# Patient Record
Sex: Male | Born: 1945 | Race: White | Marital: Married | Smoking: Former smoker
Health system: Southern US, Community
[De-identification: ages and names within clinical notes are randomized; demographics above are authoritative.]

## PROBLEM LIST (undated history)

## (undated) DIAGNOSIS — I10 Essential (primary) hypertension: Secondary | ICD-10-CM

## (undated) DIAGNOSIS — I714 Abdominal aortic aneurysm, without rupture, unspecified: Secondary | ICD-10-CM

## (undated) DIAGNOSIS — G629 Polyneuropathy, unspecified: Secondary | ICD-10-CM

## (undated) DIAGNOSIS — F0390 Unspecified dementia without behavioral disturbance: Secondary | ICD-10-CM

## (undated) DIAGNOSIS — E079 Disorder of thyroid, unspecified: Secondary | ICD-10-CM

## (undated) DIAGNOSIS — E119 Type 2 diabetes mellitus without complications: Secondary | ICD-10-CM

## (undated) DIAGNOSIS — K274 Chronic or unspecified peptic ulcer, site unspecified, with hemorrhage: Secondary | ICD-10-CM

## (undated) HISTORY — PX: TOTAL KNEE ARTHROPLASTY: SHX125

## (undated) HISTORY — PX: OTHER SURGICAL HISTORY: SHX169

## (undated) HISTORY — PX: SHOULDER SURGERY: SHX246

---

## 2015-03-26 ENCOUNTER — Other Ambulatory Visit: Payer: Self-pay | Admitting: Orthopedic Surgery

## 2015-03-26 DIAGNOSIS — M5136 Other intervertebral disc degeneration, lumbar region: Secondary | ICD-10-CM

## 2015-04-02 ENCOUNTER — Ambulatory Visit
Admission: RE | Admit: 2015-04-02 | Discharge: 2015-04-02 | Disposition: A | Discharge status: Home / Self Care | Payer: No Typology Code available for payment source | Source: Ambulatory Visit | Attending: Orthopedic Surgery | Admitting: Orthopedic Surgery

## 2015-04-02 ENCOUNTER — Other Ambulatory Visit: Payer: Self-pay | Admitting: Orthopedic Surgery

## 2015-04-02 ENCOUNTER — Inpatient Hospital Stay
Admission: RE | Admit: 2015-04-02 | Discharge: 2015-04-02 | Disposition: A | Discharge status: Home / Self Care | Payer: Self-pay | Source: Ambulatory Visit | Attending: Orthopedic Surgery | Admitting: Orthopedic Surgery

## 2015-04-02 DIAGNOSIS — R52 Pain, unspecified: Secondary | ICD-10-CM

## 2015-04-02 DIAGNOSIS — M5136 Other intervertebral disc degeneration, lumbar region: Secondary | ICD-10-CM

## 2015-04-02 MED ORDER — IOHEXOL 180 MG/ML  SOLN
15.0000 mL | Freq: Once | INTRAMUSCULAR | Status: AC | PRN
  Administered 2015-04-02: 15 mL via INTRATHECAL

## 2015-04-02 MED ORDER — DIAZEPAM 5 MG PO TABS
5.0000 mg | ORAL_TABLET | Freq: Once | ORAL | Status: AC
  Administered 2015-04-02: 5 mg via ORAL

## 2015-04-02 NOTE — Progress Notes (Signed)
Patient states he has been off Plavix for at least the past five days.  Lorren Rossetti, RN 

## 2015-04-02 NOTE — Discharge Instructions (Signed)

## 2017-11-09 ENCOUNTER — Emergency Department (HOSPITAL_BASED_OUTPATIENT_CLINIC_OR_DEPARTMENT_OTHER)
Admission: EM | Admit: 2017-11-09 | Discharge: 2017-11-09 | Disposition: A | Discharge status: Home / Self Care | Payer: Medicare Other | Attending: Emergency Medicine | Admitting: Emergency Medicine

## 2017-11-09 ENCOUNTER — Other Ambulatory Visit: Payer: Self-pay

## 2017-11-09 ENCOUNTER — Emergency Department (HOSPITAL_BASED_OUTPATIENT_CLINIC_OR_DEPARTMENT_OTHER): Payer: Medicare Other

## 2017-11-09 ENCOUNTER — Encounter (HOSPITAL_BASED_OUTPATIENT_CLINIC_OR_DEPARTMENT_OTHER): Payer: Self-pay | Admitting: *Deleted

## 2017-11-09 DIAGNOSIS — K4090 Unilateral inguinal hernia, without obstruction or gangrene, not specified as recurrent: Secondary | ICD-10-CM | POA: Diagnosis not present

## 2017-11-09 DIAGNOSIS — Z87891 Personal history of nicotine dependence: Secondary | ICD-10-CM | POA: Diagnosis not present

## 2017-11-09 DIAGNOSIS — E119 Type 2 diabetes mellitus without complications: Secondary | ICD-10-CM | POA: Insufficient documentation

## 2017-11-09 DIAGNOSIS — I10 Essential (primary) hypertension: Secondary | ICD-10-CM | POA: Insufficient documentation

## 2017-11-09 DIAGNOSIS — R109 Unspecified abdominal pain: Secondary | ICD-10-CM

## 2017-11-09 DIAGNOSIS — F0390 Unspecified dementia without behavioral disturbance: Secondary | ICD-10-CM | POA: Diagnosis not present

## 2017-11-09 DIAGNOSIS — K429 Umbilical hernia without obstruction or gangrene: Secondary | ICD-10-CM | POA: Insufficient documentation

## 2017-11-09 DIAGNOSIS — Z79891 Long term (current) use of opiate analgesic: Secondary | ICD-10-CM | POA: Insufficient documentation

## 2017-11-09 HISTORY — DX: Abdominal aortic aneurysm, without rupture, unspecified: I71.40

## 2017-11-09 HISTORY — DX: Essential (primary) hypertension: I10

## 2017-11-09 HISTORY — DX: Type 2 diabetes mellitus without complications: E11.9

## 2017-11-09 HISTORY — DX: Polyneuropathy, unspecified: G62.9

## 2017-11-09 HISTORY — DX: Abdominal aortic aneurysm, without rupture: I71.4

## 2017-11-09 HISTORY — DX: Disorder of thyroid, unspecified: E07.9

## 2017-11-09 LAB — CBC WITH DIFFERENTIAL/PLATELET
Abs Immature Granulocytes: 0.14 10*3/uL — ABNORMAL HIGH (ref 0.00–0.07)
BASOS PCT: 1 %
Basophils Absolute: 0 10*3/uL (ref 0.0–0.1)
EOS PCT: 1 %
Eosinophils Absolute: 0.1 10*3/uL (ref 0.0–0.5)
HCT: 42.6 % (ref 39.0–52.0)
HEMOGLOBIN: 14.6 g/dL (ref 13.0–17.0)
Immature Granulocytes: 2 %
Lymphocytes Relative: 18 %
Lymphs Abs: 1.3 10*3/uL (ref 0.7–4.0)
MCH: 31.2 pg (ref 26.0–34.0)
MCHC: 34.3 g/dL (ref 30.0–36.0)
MCV: 91 fL (ref 80.0–100.0)
MONO ABS: 0.6 10*3/uL (ref 0.1–1.0)
Monocytes Relative: 8 %
NEUTROS PCT: 70 %
Neutro Abs: 5.1 10*3/uL (ref 1.7–7.7)
PLATELETS: 202 10*3/uL (ref 150–400)
RBC: 4.68 MIL/uL (ref 4.22–5.81)
RDW: 12.3 % (ref 11.5–15.5)
WBC: 7.2 10*3/uL (ref 4.0–10.5)
nRBC: 0 % (ref 0.0–0.2)

## 2017-11-09 LAB — COMPREHENSIVE METABOLIC PANEL
ALBUMIN: 4 g/dL (ref 3.5–5.0)
ALT: 24 U/L (ref 0–44)
AST: 20 U/L (ref 15–41)
Alkaline Phosphatase: 70 U/L (ref 38–126)
Anion gap: 14 (ref 5–15)
BILIRUBIN TOTAL: 0.9 mg/dL (ref 0.3–1.2)
BUN: 17 mg/dL (ref 8–23)
CO2: 23 mmol/L (ref 22–32)
Calcium: 9.4 mg/dL (ref 8.9–10.3)
Chloride: 100 mmol/L (ref 98–111)
Creatinine, Ser: 1.33 mg/dL — ABNORMAL HIGH (ref 0.61–1.24)
GFR calc Af Amer: >60 mL/min (ref >60)
GFR calc non Af Amer: 52 mL/min — ABNORMAL LOW (ref >60)
GLUCOSE: 151 mg/dL — AB (ref 70–99)
POTASSIUM: 3.8 mmol/L (ref 3.5–5.1)
Sodium: 137 mmol/L (ref 135–145)
TOTAL PROTEIN: 7.3 g/dL (ref 6.5–8.1)

## 2017-11-09 LAB — LIPASE, BLOOD: LIPASE: 31 U/L (ref 11–51)

## 2017-11-09 MED ORDER — IOPAMIDOL (ISOVUE-300) INJECTION 61%
100.0000 mL | Freq: Once | INTRAVENOUS | Status: AC | PRN
  Administered 2017-11-09: 100 mL via INTRAVENOUS

## 2017-11-09 MED ORDER — SODIUM CHLORIDE 0.9 % IV SOLN
INTRAVENOUS | Status: DC
  Administered 2017-11-09: 21:00:00 via INTRAVENOUS

## 2017-11-09 NOTE — Discharge Instructions (Signed)
Work-up here today without any specific findings.  Other than an umbilical hernia in the left inguinal hernia.  No evidence of any incarceration or strangulation.  With this could be causing some discomfort.  Otherwise work-up was negative.  There was some haziness around the gallbladder but liver function test and labs very normal.  Also no tenderness to palpation over the gallbladder.  Make an appointment to follow-up with general surgery for evaluation.  Also follow-up with primary care doctor.  Return for any new or worse symptoms.

## 2017-11-09 NOTE — ED Triage Notes (Addendum)
Abdominal pain for almost 2 weeks. He was seen at [facility] last week with hernia diagnosis. Pt has a hx of dementia. His wife is with him.

## 2017-11-09 NOTE — ED Notes (Signed)
ED Provider at bedside. 

## 2017-11-09 NOTE — ED Provider Notes (Signed)
[facility] EMERGENCY DEPARTMENT Provider Note   CSN: [id number] Arrival date & time: 11/09/17  1746     History   Chief Complaint Chief Complaint  Patient presents with  . Abdominal Pain    HPI Gabriel Orozco is a 72 y.o. male.  Patient brought in by his wife.  He has a history of dementia.  Apparently patient's been complaining of abdominal pain for about a 1-1/2 weeks.  No nausea or vomiting no fevers no diarrhea.  No blood in the uric.  No blood in bowel movements.  They were told in the past that he has hernias.     Past Medical History:  Diagnosis Date  . Abdominal aneurysm (HCC)   . Diabetes mellitus without complication (HCC)   . Hypertension   . Neuropathy   . Thyroid disease     There are no active problems to display for this patient.        Home Medications    Prior to Admission medications   Medication Sig Start Date End Date Taking? Authorizing Provider  ALBUTEROL IN Inhale into the lungs.   Yes [provider]  AMLODIPINE BENZOATE PO Take by mouth.   Yes [provider]  diclofenac (VOLTAREN) 75 MG EC tablet Take 75 mg by mouth 2 (two) times daily.   Yes [provider]  losartan (COZAAR) 100 MG tablet Take 100 mg by mouth daily.   Yes [provider]  memantine (NAMENDA) 10 MG tablet Take 10 mg by mouth 2 (two) times daily.   Yes [provider]  METFORMIN HCL PO Take by mouth.   Yes [provider]  QUEtiapine (SEROQUEL) 100 MG tablet Take 100 mg by mouth at bedtime.   Yes [provider]  SIMVASTATIN PO Take by mouth.   Yes [provider]    Family History No family history on file.  Social History Social History   Tobacco Use  . Smoking status: Former Games developer  . Smokeless tobacco: Never Used  Substance Use Topics  . Alcohol use: Not Currently  . Drug use: Never     Allergies   Patient has no known allergies.   Review of Systems Review of  Systems  Unable to perform ROS: Dementia     Physical Exam Updated Vital Signs BP 120/89   Pulse 98   Temp 98.3 F (36.8 C)   Resp 18   Ht 1.778 m (5\' 10" )   Wt 99.3 kg   SpO2 99%   BMI 31.42 kg/m   Physical Exam  Constitutional: He appears well-developed and well-nourished. No distress.  HENT:  Head: Normocephalic and atraumatic.  Mouth/Throat: Oropharynx is clear and moist.  Eyes: Pupils are equal, round, and reactive to light. Conjunctivae and EOM are normal.  Neck: Neck supple.  Cardiovascular: Normal rate, regular rhythm and normal heart sounds.  Pulmonary/Chest: Effort normal and breath sounds normal. No respiratory distress.  Abdominal: Soft. Bowel sounds are normal. He exhibits no distension and no mass. There is no tenderness. No hernia.  Genitourinary: Penis normal.  Genitourinary Comments: No obvious groin hernia.  Musculoskeletal: Normal range of motion.  Neurological: He is alert. No cranial nerve deficit. He exhibits normal muscle tone. Coordination normal.  Skin: Skin is warm. No rash noted.  Nursing note and vitals reviewed.    ED Treatments / Results  Labs (all labs ordered are listed, but only abnormal results are displayed) Labs Reviewed  COMPREHENSIVE METABOLIC PANEL - Abnormal; Notable for the  following components:      Result Value   Glucose, Bld 151 (*)    Creatinine, Ser 1.33 (*)    GFR calc non Af Amer 52 (*)    All other components within normal limits  CBC WITH DIFFERENTIAL/PLATELET - Abnormal; Notable for the following components:   Abs Immature Granulocytes 0.14 (*)    All other components within normal limits  LIPASE, BLOOD  URINALYSIS, ROUTINE W REFLEX MICROSCOPIC    EKG None  Radiology Ct Abdomen Pelvis W Contrast  Result Date: 11/09/2017 CLINICAL DATA:  Mid abdominal pain. EXAM: CT ABDOMEN AND PELVIS WITH CONTRAST TECHNIQUE: Multidetector CT imaging of the abdomen and pelvis was performed using the standard protocol  following bolus administration of intravenous contrast. CONTRAST:  ISOVUE-300 IOPAMIDOL (ISOVUE-300) INJECTION 61% COMPARISON:  None. FINDINGS: Lower chest: Lung bases are clear. No effusions. Heart is normal size. Extensive coronary artery calcifications and aortic calcifications. Hepatobiliary: Mild haziness/stranding around the gallbladder. Small scattered cysts within the liver. No biliary ductal dilatation. Pancreas: No focal abnormality or ductal dilatation. Spleen: No focal abnormality.  Normal size. Adrenals/Urinary Tract: Extensive renovascular calcifications. No hydronephrosis. 2.4 cm cyst in the midpole of the right kidney. Small nodule off the inferior left adrenal gland measures 2 cm. Right adrenal gland and urinary bladder are unremarkable. Stomach/Bowel: Descending colonic and sigmoid diverticulosis. No active diverticulitis. Stomach and small bowel decompressed, unremarkable. Appendix is normal. Vascular/Lymphatic: Diffuse aortic, iliac and branch vessel calcifications. Mild aneurysmal dilatation of the infrarenal aorta, 3.8 cm maximally. Reproductive: Mild prostate enlargement. Other: No free fluid or free air. Small umbilical hernia and left inguinal hernia containing fat. Musculoskeletal: No acute bony abnormality. IMPRESSION: There is stranding/haziness around the gallbladder which could reflect changes of acute cholecystitis. Consider further evaluation with right upper quadrant ultrasound. 3.8 cm infrarenal abdominal aortic aneurysm. Recommend followup by ultrasound in 2 years. This recommendation follows ACR consensus guidelines: White Paper of the ACR Incidental Findings Committee II on Vascular Findings. J Am Coll Radiol 2013; 10:789-794. Left colonic diverticulosis. Extensive coronary artery disease. Electronically Signed   By: Charlett Nose M.D.   On: 11/09/2017 22:29    Procedures Procedures (including critical care time)  Medications Ordered in ED Medications  0.9 %  sodium  chloride infusion ( Intravenous Stopped 11/09/17 2320)  iopamidol (ISOVUE-300) 61 % injection 100 mL (100 mLs Intravenous Contrast Given 11/09/17 2207)     Initial Impression / Assessment and Plan / ED Course  I have reviewed the triage vital signs and the nursing notes.  Pertinent labs & imaging results that were available during my care of the patient were reviewed by me and considered in my medical decision making (see chart for details).     CT scan showed evidence of a small umbilical hernia and a left inguinal hernia.  They were not evident on exam.  Also there was haziness around the gallbladder.  Patient had no tenderness in the right upper quadrant.  In addition white counts normal liver function test are normal.  Do not feel patient has cholecystitis.  Patient very nontoxic no acute distress.  Recommend follow-up with [place] surgery for evaluation and possible repair of the hernias at their discretion.  Patient's wife will make an appointment.  He will follow-up with his primary care doctors they will return for any new or worse symptoms.  Final Clinical Impressions(s) / ED Diagnoses   Final diagnoses:  Abdominal pain, unspecified abdominal location  Umbilical hernia without obstruction and without gangrene  Unilateral inguinal hernia without obstruction or gangrene, recurrence not specified    ED Discharge Orders    None       Vanetta Mulders, MD 11/09/17 2332

## 2017-11-09 NOTE — ED Notes (Signed)
Patient transported to CT 

## 2017-11-09 NOTE — ED Notes (Signed)
Per wife pt having lower abd pain burping,  Decreased po intake x 1-2 weeks  Was seen for same and dx w hernia,  Pt has dementia

## 2017-11-16 ENCOUNTER — Encounter (HOSPITAL_COMMUNITY): Payer: Self-pay | Admitting: Emergency Medicine

## 2017-11-16 ENCOUNTER — Inpatient Hospital Stay (HOSPITAL_COMMUNITY)
Admission: EM | Admit: 2017-11-16 | Discharge: 2017-11-22 | DRG: 377 | Disposition: A | Discharge status: Home Health | Payer: No Typology Code available for payment source | Attending: Internal Medicine | Admitting: Internal Medicine

## 2017-11-16 DIAGNOSIS — E1140 Type 2 diabetes mellitus with diabetic neuropathy, unspecified: Secondary | ICD-10-CM | POA: Diagnosis present

## 2017-11-16 DIAGNOSIS — Z8249 Family history of ischemic heart disease and other diseases of the circulatory system: Secondary | ICD-10-CM | POA: Diagnosis not present

## 2017-11-16 DIAGNOSIS — K2981 Duodenitis with bleeding: Secondary | ICD-10-CM | POA: Diagnosis present

## 2017-11-16 DIAGNOSIS — I959 Hypotension, unspecified: Secondary | ICD-10-CM | POA: Diagnosis present

## 2017-11-16 DIAGNOSIS — E1165 Type 2 diabetes mellitus with hyperglycemia: Secondary | ICD-10-CM | POA: Diagnosis not present

## 2017-11-16 DIAGNOSIS — K269 Duodenal ulcer, unspecified as acute or chronic, without hemorrhage or perforation: Secondary | ICD-10-CM | POA: Diagnosis not present

## 2017-11-16 DIAGNOSIS — J690 Pneumonitis due to inhalation of food and vomit: Secondary | ICD-10-CM | POA: Diagnosis not present

## 2017-11-16 DIAGNOSIS — D509 Iron deficiency anemia, unspecified: Secondary | ICD-10-CM | POA: Diagnosis present

## 2017-11-16 DIAGNOSIS — K559 Vascular disorder of intestine, unspecified: Secondary | ICD-10-CM | POA: Diagnosis present

## 2017-11-16 DIAGNOSIS — K2980 Duodenitis without bleeding: Secondary | ICD-10-CM | POA: Diagnosis not present

## 2017-11-16 DIAGNOSIS — F05 Delirium due to known physiological condition: Secondary | ICD-10-CM | POA: Diagnosis present

## 2017-11-16 DIAGNOSIS — K5730 Diverticulosis of large intestine without perforation or abscess without bleeding: Secondary | ICD-10-CM | POA: Diagnosis present

## 2017-11-16 DIAGNOSIS — K264 Chronic or unspecified duodenal ulcer with hemorrhage: Secondary | ICD-10-CM | POA: Diagnosis present

## 2017-11-16 DIAGNOSIS — I10 Essential (primary) hypertension: Secondary | ICD-10-CM | POA: Diagnosis present

## 2017-11-16 DIAGNOSIS — N179 Acute kidney failure, unspecified: Secondary | ICD-10-CM | POA: Diagnosis present

## 2017-11-16 DIAGNOSIS — Z800 Family history of malignant neoplasm of digestive organs: Secondary | ICD-10-CM

## 2017-11-16 DIAGNOSIS — R338 Other retention of urine: Secondary | ICD-10-CM | POA: Diagnosis present

## 2017-11-16 DIAGNOSIS — E876 Hypokalemia: Secondary | ICD-10-CM | POA: Diagnosis present

## 2017-11-16 DIAGNOSIS — R4701 Aphasia: Secondary | ICD-10-CM | POA: Diagnosis present

## 2017-11-16 DIAGNOSIS — R059 Cough, unspecified: Secondary | ICD-10-CM

## 2017-11-16 DIAGNOSIS — D62 Acute posthemorrhagic anemia: Secondary | ICD-10-CM | POA: Diagnosis present

## 2017-11-16 DIAGNOSIS — K922 Gastrointestinal hemorrhage, unspecified: Secondary | ICD-10-CM | POA: Diagnosis present

## 2017-11-16 DIAGNOSIS — I2510 Atherosclerotic heart disease of native coronary artery without angina pectoris: Secondary | ICD-10-CM | POA: Diagnosis present

## 2017-11-16 DIAGNOSIS — E049 Nontoxic goiter, unspecified: Secondary | ICD-10-CM | POA: Diagnosis present

## 2017-11-16 DIAGNOSIS — E785 Hyperlipidemia, unspecified: Secondary | ICD-10-CM | POA: Diagnosis present

## 2017-11-16 DIAGNOSIS — F0391 Unspecified dementia with behavioral disturbance: Secondary | ICD-10-CM | POA: Diagnosis present

## 2017-11-16 DIAGNOSIS — E1169 Type 2 diabetes mellitus with other specified complication: Secondary | ICD-10-CM | POA: Diagnosis present

## 2017-11-16 DIAGNOSIS — Z7982 Long term (current) use of aspirin: Secondary | ICD-10-CM

## 2017-11-16 DIAGNOSIS — K4090 Unilateral inguinal hernia, without obstruction or gangrene, not specified as recurrent: Secondary | ICD-10-CM | POA: Diagnosis present

## 2017-11-16 DIAGNOSIS — F1011 Alcohol abuse, in remission: Secondary | ICD-10-CM | POA: Diagnosis present

## 2017-11-16 DIAGNOSIS — Z885 Allergy status to narcotic agent status: Secondary | ICD-10-CM | POA: Diagnosis not present

## 2017-11-16 DIAGNOSIS — F4310 Post-traumatic stress disorder, unspecified: Secondary | ICD-10-CM | POA: Diagnosis present

## 2017-11-16 DIAGNOSIS — I714 Abdominal aortic aneurysm, without rupture: Secondary | ICD-10-CM | POA: Diagnosis present

## 2017-11-16 DIAGNOSIS — N401 Enlarged prostate with lower urinary tract symptoms: Secondary | ICD-10-CM | POA: Diagnosis present

## 2017-11-16 DIAGNOSIS — Z66 Do not resuscitate: Secondary | ICD-10-CM | POA: Diagnosis present

## 2017-11-16 DIAGNOSIS — Z8719 Personal history of other diseases of the digestive system: Secondary | ICD-10-CM | POA: Diagnosis not present

## 2017-11-16 DIAGNOSIS — R05 Cough: Secondary | ICD-10-CM

## 2017-11-16 DIAGNOSIS — K551 Chronic vascular disorders of intestine: Secondary | ICD-10-CM | POA: Diagnosis present

## 2017-11-16 DIAGNOSIS — Z87891 Personal history of nicotine dependence: Secondary | ICD-10-CM | POA: Diagnosis not present

## 2017-11-16 DIAGNOSIS — Z79899 Other long term (current) drug therapy: Secondary | ICD-10-CM

## 2017-11-16 DIAGNOSIS — F0390 Unspecified dementia without behavioral disturbance: Secondary | ICD-10-CM | POA: Diagnosis not present

## 2017-11-16 DIAGNOSIS — H9190 Unspecified hearing loss, unspecified ear: Secondary | ICD-10-CM | POA: Diagnosis present

## 2017-11-16 DIAGNOSIS — K262 Acute duodenal ulcer with both hemorrhage and perforation: Secondary | ICD-10-CM | POA: Diagnosis not present

## 2017-11-16 DIAGNOSIS — Z7984 Long term (current) use of oral hypoglycemic drugs: Secondary | ICD-10-CM

## 2017-11-16 DIAGNOSIS — K429 Umbilical hernia without obstruction or gangrene: Secondary | ICD-10-CM | POA: Diagnosis present

## 2017-11-16 HISTORY — DX: Chronic or unspecified peptic ulcer, site unspecified, with hemorrhage: K27.4

## 2017-11-16 HISTORY — DX: Unspecified dementia, unspecified severity, without behavioral disturbance, psychotic disturbance, mood disturbance, and anxiety: F03.90

## 2017-11-16 LAB — TYPE AND SCREEN
ABO/RH(D): O POS
Antibody Screen: NEGATIVE

## 2017-11-16 LAB — CBC
HEMATOCRIT: 33.3 % — AB (ref 39.0–52.0)
Hemoglobin: 11.3 g/dL — ABNORMAL LOW (ref 13.0–17.0)
MCH: 31.4 pg (ref 26.0–34.0)
MCHC: 33.9 g/dL (ref 30.0–36.0)
MCV: 92.5 fL (ref 80.0–100.0)
PLATELETS: 299 10*3/uL (ref 150–400)
RBC: 3.6 MIL/uL — ABNORMAL LOW (ref 4.22–5.81)
RDW: 12.2 % (ref 11.5–15.5)
WBC: 15.1 10*3/uL — ABNORMAL HIGH (ref 4.0–10.5)
nRBC: 0 % (ref 0.0–0.2)

## 2017-11-16 LAB — BASIC METABOLIC PANEL
Anion gap: 11 (ref 5–15)
BUN: 26 mg/dL — ABNORMAL HIGH (ref 8–23)
CHLORIDE: 107 mmol/L (ref 98–111)
CO2: 19 mmol/L — AB (ref 22–32)
CREATININE: 1.78 mg/dL — AB (ref 0.61–1.24)
Calcium: 8.7 mg/dL — ABNORMAL LOW (ref 8.9–10.3)
GFR calc Af Amer: 43 mL/min — ABNORMAL LOW (ref >60)
GFR calc non Af Amer: 37 mL/min — ABNORMAL LOW (ref >60)
Glucose, Bld: 215 mg/dL — ABNORMAL HIGH (ref 70–99)
Potassium: 4.3 mmol/L (ref 3.5–5.1)
SODIUM: 137 mmol/L (ref 135–145)

## 2017-11-16 LAB — HEPATIC FUNCTION PANEL
ALBUMIN: 3.2 g/dL — AB (ref 3.5–5.0)
ALK PHOS: 57 U/L (ref 38–126)
ALT: 17 U/L (ref 0–44)
AST: 19 U/L (ref 15–41)
BILIRUBIN TOTAL: 0.8 mg/dL (ref 0.3–1.2)
Bilirubin, Direct: 0.2 mg/dL (ref 0.0–0.2)
Indirect Bilirubin: 0.6 mg/dL (ref 0.3–0.9)
TOTAL PROTEIN: 6 g/dL — AB (ref 6.5–8.1)

## 2017-11-16 LAB — PROTIME-INR
INR: 1.26
Prothrombin Time: 15.6 seconds — ABNORMAL HIGH (ref 11.4–15.2)

## 2017-11-16 LAB — ABO/RH: ABO/RH(D): O POS

## 2017-11-16 LAB — CBG MONITORING, ED: Glucose-Capillary: 152 mg/dL — ABNORMAL HIGH (ref 70–99)

## 2017-11-16 LAB — POC OCCULT BLOOD, ED: Fecal Occult Bld: POSITIVE — AB

## 2017-11-16 MED ORDER — SODIUM CHLORIDE 0.9 % IV SOLN
INTRAVENOUS | Status: DC
  Administered 2017-11-17 – 2017-11-22 (×11): via INTRAVENOUS

## 2017-11-16 MED ORDER — SODIUM CHLORIDE 0.9 % IV BOLUS
1000.0000 mL | Freq: Once | INTRAVENOUS | Status: AC
  Administered 2017-11-16: 1000 mL via INTRAVENOUS

## 2017-11-16 MED ORDER — PANTOPRAZOLE SODIUM 40 MG IV SOLR
40.0000 mg | Freq: Once | INTRAVENOUS | Status: AC
  Administered 2017-11-16: 40 mg via INTRAVENOUS
  Filled 2017-11-16: qty 40
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
## 2017-11-16 NOTE — ED Notes (Signed)
Family at bedside. 
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
## 2017-11-16 NOTE — ED Notes (Signed)
Urinal given to pt's family.  Made them aware of the need for urine specimen.

## 2017-11-16 NOTE — ED Provider Notes (Addendum)
[facility] EMERGENCY DEPARTMENT Provider Note   CSN: [id number] Arrival date & time: 11/16/17  2054     History   Chief Complaint Chief Complaint  Patient presents with  . GI Bleeding  . Near Syncope    HPI Gabriel Orozco is a 72 y.o. male.  Patient with history of dementia presents emergency department with complaint of GI bleeding and lightheadedness.  Approximately 530 tonight patient began to have a large amount of bleeding.  Patient had several bloody stools which ranged from bright red to dark red.  Patient was lightheaded with standing per family.  Found to be mildly hypotensive at 90/40 by EMS.  Patient has had some right-sided abdominal pain recently but denies at the current time.  No anticoagulation.  No fevers, vomiting, or diarrhea.  Patient with history of colonoscopy performed in [place].  Patient reports h/o diverticulosis on previous colonoscopy performed by a doctor in [place].  Patient has never had diverticulitis.  He has had bleeding at home before but has not wanted to come to the hospital.  No hematemesis, easy bruising or bleeding.  The onset of this condition was acute. The course is constant. Aggravating factors: none. Alleviating factors: none.       Past Medical History:  Diagnosis Date  . Abdominal aneurysm (HCC)   . Diabetes mellitus without complication (HCC)   . Hypertension   . Neuropathy   . Thyroid disease     There are no active problems to display for this patient.   History reviewed. No pertinent surgical history.      Home Medications    Prior to Admission medications   Medication Sig Start Date End Date Taking? Authorizing Provider  ALBUTEROL IN Inhale into the lungs.    [provider]  AMLODIPINE BENZOATE PO Take by mouth.    [provider]  diclofenac (VOLTAREN) 75 MG EC tablet Take 75 mg by mouth 2 (two) times daily.    [provider]  losartan (COZAAR) 100 MG tablet  Take 100 mg by mouth daily.    [provider]  memantine (NAMENDA) 10 MG tablet Take 10 mg by mouth 2 (two) times daily.    [provider]  METFORMIN HCL PO Take by mouth.    [provider]  QUEtiapine (SEROQUEL) 100 MG tablet Take 100 mg by mouth at bedtime.    [provider]  SIMVASTATIN PO Take by mouth.    [provider]    Family History No family history on file.  Social History Social History   Tobacco Use  . Smoking status: Former Games developer  . Smokeless tobacco: Never Used  Substance Use Topics  . Alcohol use: Not Currently  . Drug use: Never     Allergies   Patient has no known allergies.   Review of Systems Review of Systems  Constitutional: Negative for fever.  HENT: Negative for rhinorrhea and sore throat.   Eyes: Negative for redness.  Respiratory: Negative for cough.   Cardiovascular: Negative for chest pain.  Gastrointestinal: Positive for blood in stool. Negative for abdominal pain, diarrhea, nausea and vomiting.  Genitourinary: Negative for dysuria.  Musculoskeletal: Negative for myalgias.  Skin: Negative for rash.  Neurological: Positive for light-headedness. Negative for headaches.     Physical Exam Updated Vital Signs BP 105/70 (BP Location: Right Arm)   Pulse 89   Resp 16   SpO2 98%   Physical Exam  Constitutional: He appears well-developed and well-nourished.  HENT:  Head: Normocephalic and atraumatic.  Eyes: Conjunctivae are normal. Right eye exhibits no discharge. Left eye exhibits no discharge.  Neck: Normal range of motion. Neck supple.  Cardiovascular: Normal rate, regular rhythm and normal heart sounds.  Pulmonary/Chest: Effort normal and breath sounds normal.  Abdominal: Soft. There is no tenderness.  Genitourinary: Rectal exam shows guaiac positive stool. Rectal exam shows no external hemorrhoid, no internal hemorrhoid and no tenderness.  Neurological: He is alert.  Skin: Skin is  warm and dry.  Psychiatric: He has a normal mood and affect.  Nursing note and vitals reviewed.    ED Treatments / Results  Labs (all labs ordered are listed, but only abnormal results are displayed) Labs Reviewed  BASIC METABOLIC PANEL - Abnormal; Notable for the following components:      Result Value   CO2 19 (*)    Glucose, Bld 215 (*)    BUN 26 (*)    Creatinine, Ser 1.78 (*)    Calcium 8.7 (*)    GFR calc non Af Amer 37 (*)    GFR calc Af Amer 43 (*)    All other components within normal limits  CBC - Abnormal; Notable for the following components:   WBC 15.1 (*)    RBC 3.60 (*)    Hemoglobin 11.3 (*)    HCT 33.3 (*)    All other components within normal limits  HEPATIC FUNCTION PANEL - Abnormal; Notable for the following components:   Total Protein 6.0 (*)    Albumin 3.2 (*)    All other components within normal limits  PROTIME-INR - Abnormal; Notable for the following components:   Prothrombin Time 15.6 (*)    All other components within normal limits  CBG MONITORING, ED - Abnormal; Notable for the following components:   Glucose-Capillary 152 (*)    All other components within normal limits  POC OCCULT BLOOD, ED - Abnormal; Notable for the following components:   Fecal Occult Bld POSITIVE (*)    All other components within normal limits  URINALYSIS, ROUTINE W REFLEX MICROSCOPIC  TYPE AND SCREEN  ABO/RH    EKG EKG Interpretation  Date/Time:  Tuesday November 16 2017 21:11:58 EDT Ventricular Rate:  95 PR Interval:    QRS Duration: 91 QT Interval:  386 QTC Calculation: 486 R Axis:   48 Text Interpretation:  Sinus tachycardia Multiple ventricular premature complexes Borderline low voltage, extremity leads Borderline repolarization abnormality Borderline prolonged QT interval Confirmed by Virgina Norfolk [phone]) on 11/16/2017 9:14:19 PM   Radiology No results found.  Procedures Procedures (including critical care time)  Medications Ordered in  ED Medications  0.9 %  sodium chloride infusion (has no administration in time range)  pantoprazole (PROTONIX) injection 40 mg (40 mg Intravenous Given 11/16/17 2142)     Initial Impression / Assessment and Plan / ED Course  I have reviewed the triage vital signs and the nursing notes.  Pertinent labs & imaging results that were available during my care of the patient were reviewed by me and considered in my medical decision making (see chart for details).     Patient seen and examined.  No large volume of bleeding at the current time.  We will continue to monitor.  Fluid bolus and Protonix ordered.  Vital signs reviewed and are as follows: BP 105/70 (BP Location: Right Arm)   Pulse 89   Resp 16   SpO2 98%   Patient was seen at [facility] for abdominal  pain 1 week ago.  He had CT at that time showing haziness around the gallbladder however no clinical symptoms of cholecystitis.  Was encouraged to follow-up with [facility] surgery.  Patient has a infrarenal aortic aneurysm (3.8cm) without any complications.  11:31 PM patient discussed with and seen by Dr. Lockie Mola.  Spoke with Dr. Adela Glimpse who will see patient.  Will request GI consult in AM.   12:04 AM Spoke with Dr. Claudie Revering of [facility]. They will consult in AM.  Reccs: aggressive hydration, if worsening bleeding occurs consider tagged RBC test or CT Angio. Will need colonoscopy.    Final Clinical Impressions(s) / ED Diagnoses   Final diagnoses:  Acute GI bleeding   Admit acute GI bleed.   ED Discharge Orders    None       Renne Crigler, PA-C 11/16/17 2332    Virgina Norfolk, DO 11/16/17 2341    Renne Crigler, PA-C 11/17/17 0006    Virgina Norfolk, DO 11/17/17 1610

## 2017-11-16 NOTE — H&P (Signed)
Gabriel Orozco:[id number] DOB: Aug 12, 1945 DOA: 11/16/2017     PCP: [facility]   Outpatient Specialists:  CARDS:   Dr. Beverely Pace at [facility]   GI in [place]    Patient arrived to ER on 11/16/17 at 2054  Patient coming from: home Lives   With family    Chief Complaint:  Chief Complaint  Patient presents with  . GI Bleeding  . Near Syncope    HPI: Matheau Orona is a 72 y.o. male with medical history significant of dementia, DM2, HTN, HLD, Diverticulosis  Presented with  Hx og Gi bleed at home laege vol, bright red stool, no sig bleed now hx of diverticulosis Severe bleeding today he have had about 4 total Bloody BM's This PM he was passing blood clots no BM since 7:30 PM Wife states he has had episodes of red blood per rectum since last year.  Last week wife noted some blood on the sheets.  Was seen at [facility] for abd pain last week CT showed diverticulosis Not on anticoagulation. He is on Aspirin 81 mg, up until yesterday has been taking diclofenac No recent steroid use No hematemesis No EtOh abuse  For the past 1 week he has been clutching his epigastric region un able to provide further information  Wife states he stopped eating and drinking for the last 2-3 days  Regarding pertinent Chronic problems: Dm 3 not on insulin, takes metformin  Hx of Cinemascopy at [place]  3 months ago he was told that he had a few polyps. No hx of EGD per family.    While in ER: Orthostatic Initial bp 90/40 Started on Protonix  The following Work up has been ordered so far:  Orders Placed This Encounter  Procedures  . Basic metabolic panel  . CBC  . Urinalysis, Routine w reflex microscopic  . Hepatic function panel  . Protime-INR  . Diet NPO time specified  . Cardiac monitoring  . Saline Lock IV, Maintain IV access  . Orthostatic Vital signs  . Place X2 Large Bore IV's  . Initiate Carrier Fluid Protocol  . Check temperature  . Consult for  [facility] Admission  . Pulse oximetry, continuous  . CBG monitoring, ED  . POC occult blood, ED  . ED EKG  . EKG 12-Lead  . Type and screen [facility]  . ABO/Rh    Following Medications were ordered in ER: Medications  pantoprazole (PROTONIX) injection 40 mg (40 mg Intravenous Given 11/16/17 2142)    Significant initial  Findings: Abnormal Labs Reviewed  BASIC METABOLIC PANEL - Abnormal; Notable for the following components:      Result Value   CO2 19 (*)    Glucose, Bld 215 (*)    BUN 26 (*)    Creatinine, Ser 1.78 (*)    Calcium 8.7 (*)    GFR calc non Af Amer 37 (*)    GFR calc Af Amer 43 (*)    All other components within normal limits  CBC - Abnormal; Notable for the following components:   WBC 15.1 (*)    RBC 3.60 (*)    Hemoglobin 11.3 (*)    HCT 33.3 (*)    All other components within normal limits  HEPATIC FUNCTION PANEL - Abnormal; Notable for the following components:   Total Protein 6.0 (*)    Albumin 3.2 (*)    All other components within normal limits  PROTIME-INR - Abnormal; Notable for the following  components:   Prothrombin Time 15.6 (*)    All other components within normal limits  CBG MONITORING, ED - Abnormal; Notable for the following components:   Glucose-Capillary 152 (*)    All other components within normal limits  POC OCCULT BLOOD, ED - Abnormal; Notable for the following components:   Fecal Occult Bld POSITIVE (*)    All other components within normal limits    Na  137 K 4.3  Cr    Up from baseline see below Lab Results  Component Value Date   CREATININE 1.78 (H) 11/16/2017   CREATININE 1.33 (H) 11/09/2017      WBC  15.1  HG/HCT  stable,      Component Value Date/Time   HGB 11.3 (L) 11/16/2017 2118   HCT 33.3 (L) 11/16/2017 2118     Lactic Acid, Venous No results found for: LATICACIDVEN    UA   ordered  ECG:  Personally reviewed by me showing: HR : 95 Rhythm:  NSR,    no evidence of  ischemic changes QTC 486       ED Triage Vitals  Enc Vitals Group     BP 11/16/17 2108 105/70     Pulse Rate 11/16/17 2108 89     Resp 11/16/17 2108 16     Temp 11/16/17 2146 97.9 F (36.6 C)     Temp Source 11/16/17 2146 Oral     SpO2 11/16/17 2108 98 %     Weight --      Height --      Head Circumference --      Peak Flow --      Pain Score 11/16/17 2108 0     Pain Loc --      Pain Edu? --      Excl. in GC? --   TMAX(24)@       Latest  Blood pressure 126/63, pulse 77, temperature 97.9 F (36.6 C), temperature source Oral, resp. rate 16, SpO2 100 %.    ER Provider Called:  GI   Dr.Mazaratti  They Recommend Serial CBC Will see in AM   Hospitalist was called for admission for lower Gi Bleed   Review of Systems:    Pertinent positives include: , fatigue, weight loss   abdominal pain,  blood in stool,  Constitutional:  No weight loss, night sweats, Fevers, chillsHEENT:  No headaches, Difficulty swallowing,Tooth/dental problems,Sore throat,  No sneezing, itching, ear ache, nasal congestion, post nasal drip,  Cardio-vascular:  No chest pain, Orthopnea, PND, anasarca, dizziness, palpitations.no Bilateral lower extremity swelling  GI:  No heartburn, indigestion,nausea, vomiting, diarrhea, change in bowel habits, loss of appetite, melena, hematemesis Resp:  no shortness of breath at rest. No dyspnea on exertion, No excess mucus, no productive cough, No non-productive cough, No coughing up of blood.No change in color of mucus.No wheezing. Skin:  no rash or lesions. No jaundice GU:  no dysuria, change in color of urine, no urgency or frequency. No straining to urinate.  No flank pain.  Musculoskeletal:  No joint pain or no joint swelling. No decreased range of motion. No back pain.  Psych:  No change in mood or affect. No depression or anxiety. No memory loss.  Neuro: no localizing neurological complaints, no tingling, no weakness, no double vision, no gait  abnormality, no slurred speech, no confusion  All systems reviewed and apart from HOPI all are negative  Past Medical History:   Past Medical History:  Diagnosis Date  . Abdominal  aneurysm (HCC)   . Diabetes mellitus without complication (HCC)   . Hypertension   . Neuropathy   . Thyroid disease       History reviewed. No pertinent surgical history.  Social History:  Ambulatory  Independently     reports that he has quit smoking. He has never used smokeless tobacco. He reports that he drank alcohol. He reports that he does not use drugs.     Family History:   Family History  Problem Relation Age of Onset  . Aneurysm Mother   . Stomach cancer Father   . Esophageal cancer Son   . Hypertension Son   . Alcohol abuse Son   . CAD Brother   . Diabetes Neg Hx     Allergies: Allergies  Allergen Reactions  . Morphine And Related Hives and Itching     Prior to Admission medications   Medication Sig Start Date End Date Taking? Authorizing Provider  albuterol (PROVENTIL HFA;VENTOLIN HFA) 108 (90 Base) MCG/ACT inhaler Inhale 2 puffs into the lungs 4 (four) times daily.   Yes [provider]  amLODipine (NORVASC) 10 MG tablet Take 10 mg by mouth at bedtime.   Yes [provider]  losartan (COZAAR) 100 MG tablet Take 100 mg by mouth every morning.    Yes [provider]  memantine (NAMENDA) 10 MG tablet Take 5 mg by mouth 2 (two) times daily.    Yes [provider]  metFORMIN (GLUCOPHAGE) 500 MG tablet Take 500 mg by mouth 2 (two) times daily.    Yes [provider]  niacin (NIASPAN) 500 MG CR tablet Take 1,000 mg by mouth at bedtime.   Yes [provider]  QUEtiapine (SEROQUEL) 100 MG tablet Take 100 mg by mouth at bedtime.   Yes [provider]  simvastatin (ZOCOR) 40 MG tablet Take 20 mg by mouth at bedtime.    Yes [provider]   Physical Exam: Blood pressure 126/63, pulse 77, temperature 97.9 F  (36.6 C), temperature source Oral, resp. rate 16, SpO2 100 %. 1. General:  in No Acute distress pale-appearing 2. Psychological: Alert and   Oriented to self otherwise confused 3. Head/ENT:     Dry pale mucous Membranes                          Head Non traumatic, neck supple                         Poor Dentition 4. SKIN:   decreased Skin turgor,  Skin clean Dry and intact no rash 5. Heart: Regular rate and rhythm no  Murmur, no Rub or gallop 6. Lungs:  Clear to auscultation bilaterally, no wheezes or crackles   7. Abdomen: Soft, non-tender, Non distended bowel sounds present 8. Lower extremities: no clubbing, cyanosis, or edema 9. Neurologically Grossly intact, moving all 4 extremities equally   10. MSK: Normal range of motion   LABS:     Recent Labs  Lab 11/16/17 2118  WBC 15.1*  HGB 11.3*  HCT 33.3*  MCV 92.5  PLT 299   Basic Metabolic Panel: Recent Labs  Lab 11/16/17 2118  NA 137  K 4.3  CL 107  CO2 19*  GLUCOSE 215*  BUN 26*  CREATININE 1.78*  CALCIUM 8.7*      Recent Labs  Lab 11/16/17 2213  AST 19  ALT 17  ALKPHOS 57  BILITOT 0.8  PROT 6.0*  ALBUMIN 3.2*   No results for input(s): LIPASE, AMYLASE in the last 168 hours. No results for input(s): AMMONIA in the last 168 hours.    HbA1C: No results for input(s): HGBA1C in the last 72 hours. CBG: Recent Labs  Lab 11/16/17 2248  GLUCAP 152*      Urine analysis: No results found for: COLORURINE, APPEARANCEUR, LABSPEC, PHURINE, GLUCOSEU, HGBUR, BILIRUBINUR, KETONESUR, PROTEINUR, UROBILINOGEN, NITRITE, LEUKOCYTESUR     Cultures: No results found for: SDES, SPECREQUEST, CULT, REPTSTATUS   Radiological Exams on Admission: No results found.  Chart has been reviewed    Assessment/Plan  72 y.o. male with medical history significant of dementia, DM2, HTN, HLD, Diverticulosis  Admitted for likely lower Gi bleed  Present on Admission: . Lower GI bleed - - Suspect Lower Gi source  No hx of  PUD, melena,  BUN elevation in  proportion to elevated creatinine  - Admit  For further management given:   Age >60 years,  comorbid illnesses  , hemodynamic instability,  gross rectal bleeding,  rebleeding    -  most likely Diverticular source          -   hemodynamic instability present      - Admit to stepdown given above    -  ER  Provider spoke to gastroenterology ( [facility]) they will see patient in a.m. appreciate their consult   - serial CBC.    - Monitor for any recurrence,  evidence of hemodynamic instability or significant blood loss -  type and screen,  - Transfuse as needed for hemoglobin below 7 or <9 if evidence of significant  bleeding next CBC at 3 AM   - Establish at least 2 PIV and fluid resuscitate   - clear liquids for tonight keep nothing by mouth post midnight,  -  monitor for Recurrent significant  Bleeding of red blood and hemodynamic instability in which casebleeding scan and IR consult would be indicated.   . Dementia (HCC) -  expect some degree of sundowning while hospitalized . Essential hypertension - given  hypotension we will hold home medications . Hyperlipidemia stable will continue medications . Type 2 diabetes mellitus with hyperlipidemia (HCC) -  - Order Sensitive  SSI      -  check TSH and HgA1C  - Hold by mouth medications      Other plan as per orders.  DVT prophylaxis:  SCD      Code Status:    DNR/DNI   Per family  I had personally discussed CODE STATUS with patient and family   Family Communication:   Family   at  Bedside  plan of care was discussed with  Son,  Wife,    Disposition Plan:       To home once workup is complete and patient is stable                    Would benefit from PT/OT eval prior to DC  Ordered                                        Consults called: GI Lb Mansouraty  Admission status:  inpatient     Expect 2 midnight stay secondary to severity of patient's current illness including hemodynamic instability  despite optimal treatment (tachycardia hypotension)    and extensive comorbidities including:  DM2  dementia   That are currently affecting medical management.  I expect  patient to be hospitalized for 2 midnights requiring inpatient medical care.  Patient is at high risk for adverse outcome (such as loss of life or disability) if not treated.  Indication for inpatient stay as follows:  Hemodynamic instability despite maximal medical therapy,    inability to maintain oral hydration    Need for procedure/ intervention Need for   IV fluids, IV medications      Level of care          SDU tele indefinitely please discontinue once patient no longer qualifies      Hallee Mckenny 11/17/2017, 12:41 AM    [facility]  Pager [phone]   after 2 AM please page floor coverage PA If 7AM-7PM, please contact the day team taking care of the patient  [email or web address]  Password [email or web address]

## 2017-11-16 NOTE — ED Triage Notes (Signed)
Per EMS, pt was seen by PCP today for Abd pn X2 days, once home he had one solid stool then three bloody loose stools w/ "lots of blood." Hx of dementia,  Pt sometimes complains of pain when R abd is pressed.  Very orthostatic, near syncople episode when he tries to stand or sit at home. Given 250NS which increased his blood pressure from 90/40 to 128/68.

## 2017-11-17 ENCOUNTER — Encounter (HOSPITAL_COMMUNITY)
Admission: EM | Disposition: A | Discharge status: Home Health | Payer: Self-pay | Source: Home / Self Care | Attending: Internal Medicine

## 2017-11-17 ENCOUNTER — Encounter (HOSPITAL_COMMUNITY): Payer: Self-pay | Admitting: Internal Medicine

## 2017-11-17 ENCOUNTER — Other Ambulatory Visit: Payer: Self-pay

## 2017-11-17 DIAGNOSIS — K284 Chronic or unspecified gastrojejunal ulcer with hemorrhage: Secondary | ICD-10-CM

## 2017-11-17 DIAGNOSIS — K2980 Duodenitis without bleeding: Secondary | ICD-10-CM

## 2017-11-17 DIAGNOSIS — E785 Hyperlipidemia, unspecified: Secondary | ICD-10-CM | POA: Diagnosis present

## 2017-11-17 DIAGNOSIS — K269 Duodenal ulcer, unspecified as acute or chronic, without hemorrhage or perforation: Secondary | ICD-10-CM

## 2017-11-17 DIAGNOSIS — D62 Acute posthemorrhagic anemia: Secondary | ICD-10-CM

## 2017-11-17 DIAGNOSIS — I10 Essential (primary) hypertension: Secondary | ICD-10-CM | POA: Diagnosis present

## 2017-11-17 DIAGNOSIS — F0390 Unspecified dementia without behavioral disturbance: Secondary | ICD-10-CM | POA: Diagnosis present

## 2017-11-17 DIAGNOSIS — K262 Acute duodenal ulcer with both hemorrhage and perforation: Secondary | ICD-10-CM

## 2017-11-17 DIAGNOSIS — E1169 Type 2 diabetes mellitus with other specified complication: Secondary | ICD-10-CM | POA: Diagnosis present

## 2017-11-17 DIAGNOSIS — K922 Gastrointestinal hemorrhage, unspecified: Secondary | ICD-10-CM

## 2017-11-17 DIAGNOSIS — K274 Chronic or unspecified peptic ulcer, site unspecified, with hemorrhage: Secondary | ICD-10-CM

## 2017-11-17 HISTORY — PX: ESOPHAGOGASTRODUODENOSCOPY: SHX5428

## 2017-11-17 HISTORY — PX: HOT HEMOSTASIS: SHX5433

## 2017-11-17 HISTORY — DX: Chronic or unspecified gastrojejunal ulcer with hemorrhage: K28.4

## 2017-11-17 HISTORY — DX: Chronic or unspecified peptic ulcer, site unspecified, with hemorrhage: K27.4

## 2017-11-17 HISTORY — PX: BIOPSY: SHX5522

## 2017-11-17 LAB — GLUCOSE, CAPILLARY
Glucose-Capillary: 109 mg/dL — ABNORMAL HIGH (ref 70–99)
Glucose-Capillary: 113 mg/dL — ABNORMAL HIGH (ref 70–99)

## 2017-11-17 LAB — MAGNESIUM: MAGNESIUM: 1.4 mg/dL — AB (ref 1.7–2.4)

## 2017-11-17 LAB — URINALYSIS, ROUTINE W REFLEX MICROSCOPIC
BILIRUBIN URINE: NEGATIVE
Glucose, UA: 50 mg/dL — AB
Hgb urine dipstick: NEGATIVE
Ketones, ur: 5 mg/dL — AB
Leukocytes, UA: NEGATIVE
NITRITE: NEGATIVE
PH: 5 (ref 5.0–8.0)
Protein, ur: NEGATIVE mg/dL
Specific Gravity, Urine: 1.019 (ref 1.005–1.030)

## 2017-11-17 LAB — CBC
HCT: 33.7 % — ABNORMAL LOW (ref 39.0–52.0)
Hemoglobin: 11.1 g/dL — ABNORMAL LOW (ref 13.0–17.0)
MCH: 30.7 pg (ref 26.0–34.0)
MCHC: 32.9 g/dL (ref 30.0–36.0)
MCV: 93.1 fL (ref 80.0–100.0)
NRBC: 0 % (ref 0.0–0.2)
PLATELETS: 240 10*3/uL (ref 150–400)
RBC: 3.62 MIL/uL — ABNORMAL LOW (ref 4.22–5.81)
RDW: 12.3 % (ref 11.5–15.5)
WBC: 6.6 10*3/uL (ref 4.0–10.5)

## 2017-11-17 LAB — COMPREHENSIVE METABOLIC PANEL
ALK PHOS: 41 U/L (ref 38–126)
ALT: 14 U/L (ref 0–44)
ANION GAP: 9 (ref 5–15)
AST: 13 U/L — ABNORMAL LOW (ref 15–41)
Albumin: 2.7 g/dL — ABNORMAL LOW (ref 3.5–5.0)
BILIRUBIN TOTAL: 0.5 mg/dL (ref 0.3–1.2)
BUN: 31 mg/dL — ABNORMAL HIGH (ref 8–23)
CALCIUM: 8.4 mg/dL — AB (ref 8.9–10.3)
CO2: 20 mmol/L — ABNORMAL LOW (ref 22–32)
CREATININE: 1.44 mg/dL — AB (ref 0.61–1.24)
Chloride: 109 mmol/L (ref 98–111)
GFR calc Af Amer: 55 mL/min — ABNORMAL LOW (ref >60)
GFR, EST NON AFRICAN AMERICAN: 47 mL/min — AB (ref >60)
Glucose, Bld: 144 mg/dL — ABNORMAL HIGH (ref 70–99)
POTASSIUM: 4.8 mmol/L (ref 3.5–5.1)
Sodium: 138 mmol/L (ref 135–145)
TOTAL PROTEIN: 5.2 g/dL — AB (ref 6.5–8.1)

## 2017-11-17 LAB — TROPONIN I: Troponin I: <0.03 ng/mL (ref <0.03)

## 2017-11-17 LAB — CBG MONITORING, ED
GLUCOSE-CAPILLARY: 113 mg/dL — AB (ref 70–99)
Glucose-Capillary: 134 mg/dL — ABNORMAL HIGH (ref 70–99)
Glucose-Capillary: 104 mg/dL — ABNORMAL HIGH (ref 70–99)
Glucose-Capillary: 92 mg/dL (ref 70–99)

## 2017-11-17 LAB — HEMOGLOBIN A1C
HEMOGLOBIN A1C: 6.6 % — AB (ref 4.8–5.6)
MEAN PLASMA GLUCOSE: 142.72 mg/dL

## 2017-11-17 LAB — TSH: TSH: 2.955 u[IU]/mL (ref 0.350–4.500)

## 2017-11-17 LAB — PHOSPHORUS: Phosphorus: 1.9 mg/dL — ABNORMAL LOW (ref 2.5–4.6)

## 2017-11-17 SURGERY — EGD (ESOPHAGOGASTRODUODENOSCOPY)
Anesthesia: Moderate Sedation

## 2017-11-17 MED ORDER — ONDANSETRON HCL 4 MG PO TABS: 4.0000 mg | ORAL_TABLET | Freq: Four times a day (QID) | ORAL | Status: DC | PRN

## 2017-11-17 MED ORDER — QUETIAPINE FUMARATE 100 MG PO TABS
100.0000 mg | ORAL_TABLET | Freq: Every day | ORAL | Status: DC
  Administered 2017-11-17 – 2017-11-21 (×5): 100 mg via ORAL
  Filled 2017-11-17 (×6): qty 1

## 2017-11-17 MED ORDER — FENTANYL CITRATE (PF) 100 MCG/2ML IJ SOLN
INTRAMUSCULAR | Status: AC
  Filled 2017-11-17: qty 2

## 2017-11-17 MED ORDER — FAMOTIDINE IN NACL 20-0.9 MG/50ML-% IV SOLN
20.0000 mg | Freq: Two times a day (BID) | INTRAVENOUS | Status: DC
  Administered 2017-11-17 (×2): 20 mg via INTRAVENOUS
  Filled 2017-11-17 (×2): qty 50

## 2017-11-17 MED ORDER — MIDAZOLAM HCL 10 MG/2ML IJ SOLN
INTRAMUSCULAR | Status: DC | PRN
  Administered 2017-11-17 (×9): 1 mg via INTRAVENOUS

## 2017-11-17 MED ORDER — PANTOPRAZOLE SODIUM 40 MG IV SOLR
40.0000 mg | Freq: Two times a day (BID) | INTRAVENOUS | Status: DC
  Administered 2017-11-17 – 2017-11-19 (×5): 40 mg via INTRAVENOUS
  Filled 2017-11-17 (×5): qty 40

## 2017-11-17 MED ORDER — ACETAMINOPHEN 325 MG PO TABS: 650.0000 mg | ORAL_TABLET | Freq: Four times a day (QID) | ORAL | Status: DC | PRN

## 2017-11-17 MED ORDER — ONDANSETRON HCL 4 MG/2ML IJ SOLN: 4.0000 mg | Freq: Four times a day (QID) | INTRAMUSCULAR | Status: DC | PRN

## 2017-11-17 MED ORDER — MEMANTINE HCL 10 MG PO TABS
5.0000 mg | ORAL_TABLET | Freq: Two times a day (BID) | ORAL | Status: DC
  Administered 2017-11-17 – 2017-11-22 (×11): 5 mg via ORAL
  Filled 2017-11-17 (×12): qty 1

## 2017-11-17 MED ORDER — ACETAMINOPHEN 650 MG RE SUPP: 650.0000 mg | Freq: Four times a day (QID) | RECTAL | Status: DC | PRN

## 2017-11-17 MED ORDER — SODIUM CHLORIDE 0.9 % IV BOLUS
500.0000 mL | Freq: Once | INTRAVENOUS | Status: AC
  Administered 2017-11-17: 500 mL via INTRAVENOUS

## 2017-11-17 MED ORDER — BUTAMBEN-TETRACAINE-BENZOCAINE 2-2-14 % EX AERO
INHALATION_SPRAY | CUTANEOUS | Status: DC | PRN
  Administered 2017-11-17: 1 via TOPICAL

## 2017-11-17 MED ORDER — SODIUM CHLORIDE 0.9 % IV SOLN
INTRAVENOUS | Status: AC
  Administered 2017-11-17: 10:00:00 via INTRAVENOUS

## 2017-11-17 MED ORDER — MIDAZOLAM HCL 5 MG/ML IJ SOLN
INTRAMUSCULAR | Status: AC
  Filled 2017-11-17: qty 2

## 2017-11-17 MED ORDER — FENTANYL CITRATE (PF) 100 MCG/2ML IJ SOLN
INTRAMUSCULAR | Status: DC | PRN
  Administered 2017-11-17: 12.5 ug via INTRAVENOUS
  Administered 2017-11-17 (×3): 25 ug via INTRAVENOUS

## 2017-11-17 MED ORDER — INSULIN ASPART 100 UNIT/ML ~~LOC~~ SOLN
0.0000 [IU] | SUBCUTANEOUS | Status: DC
  Administered 2017-11-17 – 2017-11-19 (×2): 1 [IU] via SUBCUTANEOUS
  Administered 2017-11-19 – 2017-11-20 (×3): 2 [IU] via SUBCUTANEOUS
  Filled 2017-11-17: qty 1

## 2017-11-17 MED ORDER — SIMVASTATIN 20 MG PO TABS
20.0000 mg | ORAL_TABLET | Freq: Every day | ORAL | Status: DC
  Administered 2017-11-17 – 2017-11-21 (×5): 20 mg via ORAL
  Filled 2017-11-17 (×6): qty 1

## 2017-11-17 NOTE — ED Notes (Signed)
Offered to move pt. To hospital bed for comfort. Wife stated he is comfortable at this time.

## 2017-11-17 NOTE — Op Note (Signed)
[facility] Patient Name: Gabriel Orozco Procedure Date : 11/17/2017 MRN: [id number] Attending MD: Willaim Rayas. Adela Lank , MD Date of Birth: April 14, 1945 CSN: [id number] Age: 72 Admit Type: Inpatient Procedure:                Upper GI endoscopy Indications:              Suspected upper gastrointestinal bleeding, histor y                            of aspirin and NSAID use Providers:                Willaim Rayas. Adela Lank, MD, Bonney Leitz, Margo Aye, Technician Referring MD:              Medicines:                Fentanyl 87 micrograms IV, Midazolam 9 mg IV Complications:            No immediate complications. Estimated blood loss:                            Minimal. Estimated Blood Loss:     Estimated blood loss was minimal. Procedure:                Pre-Anesthesia Assessment:                           - Prior to the procedure, a History and Physical                            was performed, and patient medications and                            allergies were reviewed. The patient's tolerance of                            previous anesthesia was also reviewed. The risks                            and benefits of the procedure and the sedation                            options and risks were discussed with the patient.                            All questions were answered, and informed consent                            was obtained. Prior Anticoagulants: The patient has                            taken no previous anticoagulant or antiplatelet  agents. ASA Grade Assessment: III - A patient with                            severe systemic disease. After reviewing the risks                            and benefits, the patient was deemed in                            satisfactory condition to undergo the procedure.                           After obtaining informed consent, the endoscope was   passed under direct vision. Throughout the                            procedure, the patient's blood pressure, pulse, and                            oxygen saturations were monitored continuously. The                            [id number]-[id number] ([id number]) Olympus Adult EGD was introduced                            through the mouth, and advanced to the second part                            of duodenum. The upper GI endoscopy was                            accomplished without difficulty. The patient                            tolerated the procedure well. Scope In: Scope Out: Findings:      Esophagogastric landmarks were identified: the Z-line was found at 40       cm, the gastroesophageal junction was found at 40 cm and the upper       extent of the gastric folds was found at 40 cm from the incisors.      The exam of the esophagus was otherwise normal.      The entire examined stomach was normal. Biopsies were taken with a cold       forceps for Helicobacter pylori testing given findings in the duodenum.      One non-bleeding cratered duodenal ulcer with a visible vessel was found       in the distal portion of the bulb / entrance to the duodenal sweep. The       lesion was roughly 15-20 mm in largest dimension. There was some red       blood in the area and visible purplish appearing vessel. Fulguration to       ablate the vessel to prevent re-bleeding by gold probe was successful.      Diffuse moderate inflammation was found in the duodenal bulb. Second       portion of the  duodenum was normal. Impression:               - Esophagogastric landmarks identified.                           - Normal esophagus.                           - Normal stomach. Biopsied to rule out H pylori.                           - One non-bleeding duodenal ulcer with a visible                            vessel. Treated with a Gold probe to ablate the                            vessel.                           -  Duodenitis. Moderate Sedation:      Moderate (conscious) sedation was administered by the endoscopy nurse       and supervised by the endoscopist. The patient's oxygen saturation,       heart rate, blood pressure and response to care were monitored. Total       physician intraservice time was 35 minutes. Recommendation:           - Return patient to hospital ward for ongoing care.                           - NPO for now as this is a high risk lesion for                            rebleeding.                           - Continue present medications (IV protonix)                           - Await pathology results.                           - Serial Hgb, monitor for recurrent bleeding                           - GI service will follow Procedure Code(s):        --- Professional ---                           43255, 59, Esophagogastroduodenoscopy, flexible,                            transoral; with control of bleeding, any method                           43239, Esophagogastroduodenoscopy, flexible,  transoral; with biopsy, single or multiple                           99152, 59, Moderate sedation services provided by                            the same physician or other qualified health care                            professional performing the diagnostic or                            therapeutic service that the sedation supports,                            requiring the presence of an independent trained                            observer to assist in the monitoring of the                            patient's level of consciousness and physiological                            status; initial 15 minutes of intraservice time,                            patient age 65 years or older                           [phone], Moderate sedation; each additional 15                            minutes intraservice time Diagnosis Code(s):        --- Professional ---                            K26.4, Chronic or unspecified duodenal ulcer with                            hemorrhage                           K29.80, Duodenitis without bleeding CPT copyright 2018 [employer]. All rights reserved. The codes documented in this report are preliminary and upon coder review may  be revised to meet current compliance requirements. Viviann Spare P. Kailea Dannemiller, MD 11/17/2017 4:45:06 PM This report has been signed electronically. Number of Addenda: 0

## 2017-11-17 NOTE — Consult Note (Signed)
[facility] Consult: 8:26 AM 11/17/2017  LOS: 1 day    Referring Provider: Dr Marthenia Rolling  Primary Care Physician:  [facility] Primary Gastroenterologist:  Althia Forts.  Dr Lucrezia Starch in [place]    Reason for Consultation:  Gi bleeding.     HPI: Gabriel Orozco is a 72 y.o. male.  PMH PTSD, served 2 years combat in [place].  Dementia.  ETOH abuse, in remission.  DM 2.  Periph neuropathy.  MN goiter.  Low testosterone.  HTN.  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  Colon diverticulosis.  Previous episodes of limited painless hematochezia that have not led to hospital admissions over the last 2 or 3 years.  Infrarenal AAA.  3 cm 10/2016 per CT.   Colonoscopy in [facility] spring 2019: a few polyps, diverticulosis.  No previous EGD.    Abdominal pain located in the mid abdomen, epigastric region and left upper quadrant over the last couple of weeks.  Has lost his appetite.  LFTs, Lipase, WBCs normal.  Seen at a local urgent care, then seen on 10/22 at [facility], 10/24 seen at the [facility].   CT 10/22: stranding/haziness around the gallbladder, could reflect acute cholecystitis. Consider right upper quadrant ultrasound.  3.8 cm infrarenal abdominal aortic aneurysm.  Left colonic diverticulosis.  Extensive coronary artery disease.  Small left inguinal and umbilical hernia. Recommendation from the [place] visit was that he could be seen at [facility] surgery regarding the hernias.  No formal referral was made.  They did not feel  that the patient had cholecystitis. Between about 5 and 7 PM last night the patient had a total of 4 bloody bowel movements.  Wife says they looked like dark blood as well as bright red blood.  The patient was dizzy, weak.  He did not have nausea or vomit.  He did have trouble breathing. Brought to ED after onset painless hematochezia yesterday evening.  BPs 90/40. Pulses to 90s.  Hgb 11.3 >> 11.1, was 14.6 on 11/09/17.  MCV 93.  Platelets 200s.  WBCs 15.1 >> 6.6.  Normal coags.   BUN/creat: 17/1.3 on 10/22 >> 26/1.7 on 10/29 >> 31/1.4 on 10/30.       Home meds include 81 ASA, Diclofenac.    Currently not complaining of abdominal pain and vital signs are now stable.  Quit smoking in 2007, quit drinking alcohol in 2006  Past Medical History:  Diagnosis Date  . Abdominal aneurysm ([place])   . Diabetes mellitus without complication ([place])   . Hypertension   . Neuropathy   . Thyroid disease     History reviewed. No pertinent surgical history.  Prior to Admission medications   Medication Sig Start Date End Date Taking? Authorizing Provider  albuterol (PROVENTIL HFA;VENTOLIN HFA) 108 (90 Base) MCG/ACT inhaler Inhale 2 puffs into the lungs 4 (four)  times daily.   Yes [provider]  amLODipine (NORVASC) 10 MG tablet Take 10 mg by mouth at bedtime.   Yes [provider]  losartan (COZAAR) 100 MG tablet Take 100 mg by mouth every morning.    Yes [provider]  memantine (NAMENDA) 10 MG tablet Take 5 mg by mouth 2 (two) times daily.    Yes [provider]  metFORMIN (GLUCOPHAGE) 500 MG tablet Take 500 mg by mouth 2 (two) times daily.    Yes [provider]  niacin (NIASPAN) 500 MG CR tablet Take 1,000 mg by mouth at bedtime.   Yes [provider]  QUEtiapine (SEROQUEL) 100 MG tablet Take 100 mg by mouth at bedtime.   Yes [provider]  simvastatin (ZOCOR) 40 MG tablet Take 20 mg by mouth at bedtime.    Yes [provider]      Scheduled Meds: . insulin aspart  0-9 Units Subcutaneous Q4H  . memantine  5 mg Oral BID  . QUEtiapine  100 mg Oral QHS  . simvastatin  20 mg Oral QHS   Infusions: . sodium chloride Stopped (11/17/17 0108)  . sodium chloride 125 mL/hr at 11/17/17 0108  . famotidine (PEPCID) IV Stopped (11/17/17 0318)   PRN Meds: acetaminophen **OR** acetaminophen, ondansetron **OR** ondansetron (ZOFRAN) IV   Allergies as of 11/16/2017 - Review Complete 11/16/2017  Allergen Reaction Noted  . Morphine and related Hives and Itching 11/16/2017    Family History  Problem Relation Age of Onset  . Aneurysm Mother   . Stomach cancer Father   . Esophageal cancer Son   . Hypertension Son   . Alcohol abuse Son   . CAD Brother   . Diabetes Neg Hx     Social History   Socioeconomic History  . Marital status: Married    Spouse name: Not on file  . Number of children: Not on file  . Years of education: Not on file  . Highest education level: Not on file  Occupational History  . Not on file  Social Needs  . Financial resource strain: Not on file  . Food insecurity:    Worry: Not on file    Inability: Not on file  . Transportation needs:    Medical: Not on file    Non-medical: Not on file  Tobacco Use  . Smoking status: Former Research scientist (life sciences)  . Smokeless tobacco: Never Used  Substance and Sexual Activity  . Alcohol use: Not Currently  . Drug use: Never  . Sexual activity: Not on file  Lifestyle  . Physical activity:    Days per week: Not on file    Minutes per session: Not on file  . Stress: Not on file  Relationships  . Social connections:    Talks on phone: Not on file    Gets together: Not on file    Attends religious service: Not on file    Active member of club or organization: Not on file    Attends meetings of clubs or organizations: Not on file    Relationship status: Not on file  . Intimate partner violence:    Fear of current or ex partner: Not on file    Emotionally  abused: Not on file    Physically abused: Not on file    Forced sexual activity: Not on file  Other Topics Concern  . Not on file  Social History Narrative  . Not on file    REVIEW OF SYSTEMS:  Constitutional: Generally ambulatory and does not suffer from significant fatigue.  He is not very active. ENT:  No nose bleeds Pulm: Generally does not have shortness of breath but did feel short of breath last night. CV:  No palpitations, no LE edema.  No chest pain. GU:  No hematuria, no frequency GI:  Per HPI Heme: No unusual bleeding or bruising other than the GI bleeding. Transfusions: None Neuro: Hearing loss.  No headaches, no peripheral tingling or numbness.  No syncope. Derm:  No itching, no rash or sores.  Endocrine:  No sweats or chills.  No polyuria or dysuria Immunization: Not queried. Travel:  None beyond local counties in last few months.    PHYSICAL EXAM: Vital signs in last 24 hours: Vitals:   11/17/17 0700 11/17/17 0730  BP: 97/60 (!) 110/53  Pulse: (!) 59 (!) 56  Resp: 14 14  Temp:    SpO2: 99% 97%   Wt Readings from Last 3 Encounters:  11/09/17 99.3 kg    General: Patient looks well.  He is hard of hearing Head: No facial asymmetry or swelling.  No signs of head trauma. Eyes: No scleral icterus.  No conjunctival pallor.  EOMI. Ears: Hard of hearing. Nose: No discharge Mouth: Pink, moist, clear [place].  Tongue midline.  Dentures above and below. Neck: No JVD, no masses, no thyromegaly Lungs: Clear bilaterally.  No labored breathing or cough. Heart: RRR.  No MRG.  S1, S2 present. Abdomen: Soft.  Not tender, not distended.  Bowel sounds hypoactive.  No HSM, masses.  Hernias not appreciated.  Diastases recti.   Rectal: Scant amount of stool on exam glove.  Looks brownish-red.  No masses. Musc/Skeltl: No joint redness, swelling, gross deformity. Extremities: No CCE. Neurologic: Hard of hearing.  Oriented to self.  Not to place or year.  Has word finding  difficulty.  Requires repeated requests and direction in order to perform tasks.  No tremors. Skin: No rashes, no sores, no telangiectasia. Tattoos: None observed. Nodes: No cervical adenopathy, no inguinal adenopathy Psych: Calm, pleasant, cooperative.  With repeated directions, follows commands.  Intake/Output from previous day: 10/29 0701 - 10/30 0700 In: 2145.1 [I.V.:45.1; IV Piggyback:2100] Out: 300 [Urine:300] Intake/Output this shift: No intake/output data recorded.  LAB RESULTS: Recent Labs    11/16/17 2118 11/17/17 0215  WBC 15.1* 6.6  HGB 11.3* 11.1*  HCT 33.3* 33.7*  PLT 299 240   BMET Lab Results  Component Value Date   NA 138 11/17/2017   NA 137 11/16/2017   NA 137 11/09/2017   K 4.8 11/17/2017   K 4.3 11/16/2017   K 3.8 11/09/2017   CL 109 11/17/2017   CL 107 11/16/2017   CL 100 11/09/2017   CO2 20 (L) 11/17/2017   CO2 19 (L) 11/16/2017   CO2 23 11/09/2017   GLUCOSE 144 (H) 11/17/2017   GLUCOSE 215 (H) 11/16/2017   GLUCOSE 151 (H) 11/09/2017   BUN 31 (H) 11/17/2017   BUN 26 (H) 11/16/2017   BUN 17 11/09/2017   CREATININE 1.44 (H) 11/17/2017   CREATININE 1.78 (H) 11/16/2017   CREATININE 1.33 (H) 11/09/2017   CALCIUM 8.4 (L) 11/17/2017   CALCIUM 8.7 (L) 11/16/2017   CALCIUM 9.4 11/09/2017   LFT Recent Labs    11/16/17 2213 11/17/17 0215  PROT 6.0* 5.2*  ALBUMIN 3.2* 2.7*  AST 19 13*  ALT 17 14  ALKPHOS 57 41  BILITOT 0.8 0.5  BILIDIR 0.2  --   IBILI 0.6  --  PT/INR Lab Results  Component Value Date   INR 1.26 11/16/2017   Hepatitis Panel No results for input(s): HEPBSAG, HCVAB, HEPAIGM, HEPBIGM in the last 72 hours. C-Diff No components found for: CDIFF Lipase     Component Value Date/Time   LIPASE 31 11/09/2017 2114    Drugs of Abuse  No results found for: LABOPIA, COCAINSCRNUR, LABBENZ, AMPHETMU, THCU, LABBARB   RADIOLOGY STUDIES: No results found.    IMPRESSION:   *   Anemia.  Hgb drop 3 gm.  No trnasufusions  to date or indicated.   *   GI bleed.  Passing both red and dark, melenic type stool..  Diverticulosis on imaging.   Suspect UGI bleed given the history, Diflucan use, recent abdominal pain and anorexia, elevated BUN.  Diverticular bleed less likely but included in the differential diagnosis. Colon polyps on colonoscopy earlier this summer.   BID IV Pepcid in place.   No PPI at home.  Use of 81 ASA, Diclofenac at home mentioned in notes, not found on intake pharmacy documents.     *   Upper abd pain.   CT 1 week ago with sugg of cholecystitis, minor umbilical and left inguinal hernias.  LFTs, lipase normal  3 times in last 7 days.     *   AKI.     *   Dementia  *   DM2  *   Periph vasc dz.  CAD by CT.    PLAN:     *    Change to BID IV Protonix.  Pharm D to order.  Stop  Pepcid.     *   EGD.  If we do this this afternoon it would be with moderate sedation.  If the MD wishes to have MAC sedation, we would need to do the procedure tomorrow. For contact information of the wife, Torell Minder.  Her phone number is [phone]   Azucena Freed  11/17/2017, 8:26 AM Phone [phone]

## 2017-11-17 NOTE — ED Notes (Signed)
Pt. Wife states that pt. Has not urinated since they got here last night. Attempted to get pt. To use urinal with no success. Bladder scan done. Schorr, MD paged.

## 2017-11-17 NOTE — Progress Notes (Signed)
PROGRESS NOTE    Gabriel Orozco  ZOX:[id number] DOB: [birth year] DOA: 11/16/2017 PCP: [facility]  Outpatient Specialists:   Brief Narrative:  Patient is a 72 year old male with past medical history significant for dementia, diabetes mellitus type 2, hypertension, hyperlipidemia and diverticulosis.  Patient was admitted with dark-colored rectal bleed.  GI input is appreciated.  EGD is planned.  Hemoglobin as of 11/09/2017 was 14.6 g/dL, but dropped to 81.1 g/dL on presentation (91/47/8295).  No acute history of NSAID use, Goody powder use or steroid use.  Patient is only on aspirin.  Assessment & Plan:   Active Problems:   Lower GI bleed   Dementia (HCC)   Essential hypertension   Hyperlipidemia   Type 2 diabetes mellitus with hyperlipidemia (HCC)  Upper GI bleed: EGD revealed duodenal ulcer with visible vessel and duodenitis. GI input is highly appreciated. Continue to monitor H/H.  Dementia (HCC) Supportive care.    Essential hypertension: Monitor closely. Continue to optimize. Hold antihypertensive for low blood pressure.  Hyperlipidemia: Stable. Continue current medications  Type 2 diabetes mellitus: Continue sliding scale insulin coverage.      DVT prophylaxis: SCD Code Status: DO NOT RESUSCITATE Family Communication: Wife and other family Disposition Plan: Will depend on hospital course   Consultants:   GI  Procedures:   EGD revealed duodenal ulcer with visible vessel and duodenitis.  Antimicrobials:   None   Subjective: No history from patient.  Patient is currently recovering from EGD.  Objective: Vitals:   11/17/17 1200 11/17/17 1330 11/17/17 1428 11/17/17 1519  BP: (!) 106/51 (!) 125/58  137/64  Pulse:  60    Resp: 16 15  17   Temp:   98.2 F (36.8 C) 97.7 F (36.5 C)  TempSrc:   Oral Oral  SpO2: 99% 99%  98%  Height:   (P) 5\' 10"  (1.778 m)     Intake/Output Summary (Last 24 hours) at 11/17/2017 1559 Last data filed at  11/17/2017 0945 Gross per 24 hour  Intake 2145.14 ml  Output 2300 ml  Net -154.86 ml   There were no vitals filed for this visit.  Examination:  General exam: Appears calm and comfortable  Respiratory system: Clear to auscultation. Respiratory effort normal. Cardiovascular system: S1 & S2 heard, RRR. No pedal edema. Gastrointestinal system: Abdomen is nondistended, soft and nontender. No organomegaly or masses felt. Normal bowel sounds heard. Central nervous system: Sleepy from the effect of medications.  Seems to move all extremities.   Extremities: No leg edema.     Data Reviewed: I have personally reviewed following labs and imaging studies  CBC: Recent Labs  Lab 11/16/17 2118 11/17/17 0215  WBC 15.1* 6.6  HGB 11.3* 11.1*  HCT 33.3* 33.7*  MCV 92.5 93.1  PLT 299 240   Basic Metabolic Panel: Recent Labs  Lab 11/16/17 2118 11/17/17 0215  NA 137 138  K 4.3 4.8  CL 107 109  CO2 19* 20*  GLUCOSE 215* 144*  BUN 26* 31*  CREATININE 1.78* 1.44*  CALCIUM 8.7* 8.4*  MG  --  1.4*  PHOS  --  1.9*   GFR: Estimated Creatinine Clearance: 55.6 mL/min (A) (by C-G formula based on SCr of 1.44 mg/dL (H)). Liver Function Tests: Recent Labs  Lab 11/16/17 2213 11/17/17 0215  AST 19 13*  ALT 17 14  ALKPHOS 57 41  BILITOT 0.8 0.5  PROT 6.0* 5.2*  ALBUMIN 3.2* 2.7*   No results for input(s): LIPASE, AMYLASE in the last 168 hours. No  results for input(s): AMMONIA in the last 168 hours. Coagulation Profile: Recent Labs  Lab 11/16/17 2213  INR 1.26   Cardiac Enzymes: Recent Labs  Lab 11/17/17 0213 11/17/17 0759 11/17/17 1425  TROPONINI <0.03 <0.03 <0.03   BNP (last 3 results) No results for input(s): PROBNP in the last 8760 hours. HbA1C: Recent Labs    11/17/17 0213  HGBA1C 6.6*   CBG: Recent Labs  Lab 11/16/17 2248 11/17/17 0212 11/17/17 0354 11/17/17 0754 11/17/17 1144  GLUCAP 152* 134* 104* 113* 92   Lipid Profile: No results for input(s):  CHOL, HDL, LDLCALC, TRIG, CHOLHDL, LDLDIRECT in the last 72 hours. Thyroid Function Tests: Recent Labs    11/17/17 0213  TSH 2.955   Anemia Panel: No results for input(s): VITAMINB12, FOLATE, FERRITIN, TIBC, IRON, RETICCTPCT in the last 72 hours. Urine analysis:    Component Value Date/Time   COLORURINE YELLOW 11/17/2017 0510   APPEARANCEUR CLEAR 11/17/2017 0510   LABSPEC 1.019 11/17/2017 0510   PHURINE 5.0 11/17/2017 0510   GLUCOSEU 50 (A) 11/17/2017 0510   HGBUR NEGATIVE 11/17/2017 0510   BILIRUBINUR NEGATIVE 11/17/2017 0510   KETONESUR 5 (A) 11/17/2017 0510   PROTEINUR NEGATIVE 11/17/2017 0510   NITRITE NEGATIVE 11/17/2017 0510   LEUKOCYTESUR NEGATIVE 11/17/2017 0510   Sepsis Labs: @LABRCNTIP (procalcitonin:4,lacticidven:4)  )No results found for this or any previous visit (from the past 240 hour(s)).       Radiology Studies: No results found.      Scheduled Meds: . [MAR Hold] insulin aspart  0-9 Units Subcutaneous Q4H  . [MAR Hold] memantine  5 mg Oral BID  . [MAR Hold] pantoprazole (PROTONIX) IV  40 mg Intravenous Q12H  . [MAR Hold] QUEtiapine  100 mg Oral QHS  . [MAR Hold] simvastatin  20 mg Oral QHS   Continuous Infusions: . sodium chloride 125 mL/hr at 11/17/17 0955     LOS: 1 day    Time spent: 25 minutes.    Berton Mount, MD  [facility] Pager #: [phone] 7PM-7AM contact night coverage as above

## 2017-11-17 NOTE — Progress Notes (Signed)
Patient arrived to 4E room 10 from the ED.  Telemetry monitor applied and CCMD notified.  Patient oriented to unit and room to include call light and phone. Patient is pleasantly confused at baseline and wife states she will stay in room with patient overnight.  Will continue to monitor.

## 2017-11-17 NOTE — H&P (View-Only) (Signed)
[facility] Consult: 8:26 AM 11/17/2017  LOS: 1 day    Referring Provider: Dr Marthenia Rolling  Primary Care Physician:  [facility] Primary Gastroenterologist:  Althia Forts.  Dr Lucrezia Starch in [place]    Reason for Consultation:  Gi bleeding.     HPI: Gabriel Orozco is a 72 y.o. male.  PMH PTSD, served 2 years combat in [place].  Dementia.  ETOH abuse, in remission.  DM 2.  Periph neuropathy.  MN goiter.  Low testosterone.  HTN.  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  Colon diverticulosis.  Previous episodes of limited painless hematochezia that have not led to hospital admissions over the last 2 or 3 years.  Infrarenal AAA.  3 cm 10/2016 per CT.   Colonoscopy in [facility] spring 2019: a few polyps, diverticulosis.  No previous EGD.    Abdominal pain located in the mid abdomen, epigastric region and left upper quadrant over the last couple of weeks.  Has lost his appetite.  LFTs, Lipase, WBCs normal.  Seen at a local urgent care, then seen on 10/22 at [facility], 10/24 seen at the [facility].   CT 10/22: stranding/haziness around the gallbladder, could reflect acute cholecystitis. Consider right upper quadrant ultrasound.  3.8 cm infrarenal abdominal aortic aneurysm.  Left colonic diverticulosis.  Extensive coronary artery disease.  Small left inguinal and umbilical hernia. Recommendation from the [place] visit was that he could be seen at [facility] surgery regarding the hernias.  No formal referral was made.  They did not feel  that the patient had cholecystitis. Between about 5 and 7 PM last night the patient had a total of 4 bloody bowel movements.  Wife says they looked like dark blood as well as bright red blood.  The patient was dizzy, weak.  He did not have nausea or vomit.  He did have trouble breathing. Brought to ED after onset painless hematochezia yesterday evening.  BPs 90/40. Pulses to 90s.  Hgb 11.3 >> 11.1, was 14.6 on 11/09/17.  MCV 93.  Platelets 200s.  WBCs 15.1 >> 6.6.  Normal coags.   BUN/creat: 17/1.3 on 10/22 >> 26/1.7 on 10/29 >> 31/1.4 on 10/30.       Home meds include 81 ASA, Diclofenac.    Currently not complaining of abdominal pain and vital signs are now stable.  Quit smoking in 2007, quit drinking alcohol in 2006  Past Medical History:  Diagnosis Date  . Abdominal aneurysm ([place])   . Diabetes mellitus without complication ([place])   . Hypertension   . Neuropathy   . Thyroid disease     History reviewed. No pertinent surgical history.  Prior to Admission medications   Medication Sig Start Date End Date Taking? Authorizing Provider  albuterol (PROVENTIL HFA;VENTOLIN HFA) 108 (90 Base) MCG/ACT inhaler Inhale 2 puffs into the lungs 4 (four)  times daily.   Yes [provider]  amLODipine (NORVASC) 10 MG tablet Take 10 mg by mouth at bedtime.   Yes [provider]  losartan (COZAAR) 100 MG tablet Take 100 mg by mouth every morning.    Yes [provider]  memantine (NAMENDA) 10 MG tablet Take 5 mg by mouth 2 (two) times daily.    Yes [provider]  metFORMIN (GLUCOPHAGE) 500 MG tablet Take 500 mg by mouth 2 (two) times daily.    Yes [provider]  niacin (NIASPAN) 500 MG CR tablet Take 1,000 mg by mouth at bedtime.   Yes [provider]  QUEtiapine (SEROQUEL) 100 MG tablet Take 100 mg by mouth at bedtime.   Yes [provider]  simvastatin (ZOCOR) 40 MG tablet Take 20 mg by mouth at bedtime.    Yes [provider]      Scheduled Meds: . insulin aspart  0-9 Units Subcutaneous Q4H  . memantine  5 mg Oral BID  . QUEtiapine  100 mg Oral QHS  . simvastatin  20 mg Oral QHS   Infusions: . sodium chloride Stopped (11/17/17 0108)  . sodium chloride 125 mL/hr at 11/17/17 0108  . famotidine (PEPCID) IV Stopped (11/17/17 0318)   PRN Meds: acetaminophen **OR** acetaminophen, ondansetron **OR** ondansetron (ZOFRAN) IV   Allergies as of 11/16/2017 - Review Complete 11/16/2017  Allergen Reaction Noted  . Morphine and related Hives and Itching 11/16/2017    Family History  Problem Relation Age of Onset  . Aneurysm Mother   . Stomach cancer Father   . Esophageal cancer Son   . Hypertension Son   . Alcohol abuse Son   . CAD Brother   . Diabetes Neg Hx     Social History   Socioeconomic History  . Marital status: Married    Spouse name: Not on file  . Number of children: Not on file  . Years of education: Not on file  . Highest education level: Not on file  Occupational History  . Not on file  Social Needs  . Financial resource strain: Not on file  . Food insecurity:    Worry: Not on file    Inability: Not on file  . Transportation needs:    Medical: Not on file    Non-medical: Not on file  Tobacco Use  . Smoking status: Former Research scientist (life sciences)  . Smokeless tobacco: Never Used  Substance and Sexual Activity  . Alcohol use: Not Currently  . Drug use: Never  . Sexual activity: Not on file  Lifestyle  . Physical activity:    Days per week: Not on file    Minutes per session: Not on file  . Stress: Not on file  Relationships  . Social connections:    Talks on phone: Not on file    Gets together: Not on file    Attends religious service: Not on file    Active member of club or organization: Not on file    Attends meetings of clubs or organizations: Not on file    Relationship status: Not on file  . Intimate partner violence:    Fear of current or ex partner: Not on file    Emotionally  abused: Not on file    Physically abused: Not on file    Forced sexual activity: Not on file  Other Topics Concern  . Not on file  Social History Narrative  . Not on file    REVIEW OF SYSTEMS:  Constitutional: Generally ambulatory and does not suffer from significant fatigue.  He is not very active. ENT:  No nose bleeds Pulm: Generally does not have shortness of breath but did feel short of breath last night. CV:  No palpitations, no LE edema.  No chest pain. GU:  No hematuria, no frequency GI:  Per HPI Heme: No unusual bleeding or bruising other than the GI bleeding. Transfusions: None Neuro: Hearing loss.  No headaches, no peripheral tingling or numbness.  No syncope. Derm:  No itching, no rash or sores.  Endocrine:  No sweats or chills.  No polyuria or dysuria Immunization: Not queried. Travel:  None beyond local counties in last few months.    PHYSICAL EXAM: Vital signs in last 24 hours: Vitals:   11/17/17 0700 11/17/17 0730  BP: 97/60 (!) 110/53  Pulse: (!) 59 (!) 56  Resp: 14 14  Temp:    SpO2: 99% 97%   Wt Readings from Last 3 Encounters:  11/09/17 99.3 kg    General: Patient looks well.  He is hard of hearing Head: No facial asymmetry or swelling.  No signs of head trauma. Eyes: No scleral icterus.  No conjunctival pallor.  EOMI. Ears: Hard of hearing. Nose: No discharge Mouth: Pink, moist, clear [place].  Tongue midline.  Dentures above and below. Neck: No JVD, no masses, no thyromegaly Lungs: Clear bilaterally.  No labored breathing or cough. Heart: RRR.  No MRG.  S1, S2 present. Abdomen: Soft.  Not tender, not distended.  Bowel sounds hypoactive.  No HSM, masses.  Hernias not appreciated.  Diastases recti.   Rectal: Scant amount of stool on exam glove.  Looks brownish-red.  No masses. Musc/Skeltl: No joint redness, swelling, gross deformity. Extremities: No CCE. Neurologic: Hard of hearing.  Oriented to self.  Not to place or year.  Has word finding  difficulty.  Requires repeated requests and direction in order to perform tasks.  No tremors. Skin: No rashes, no sores, no telangiectasia. Tattoos: None observed. Nodes: No cervical adenopathy, no inguinal adenopathy Psych: Calm, pleasant, cooperative.  With repeated directions, follows commands.  Intake/Output from previous day: 10/29 0701 - 10/30 0700 In: 2145.1 [I.V.:45.1; IV Piggyback:2100] Out: 300 [Urine:300] Intake/Output this shift: No intake/output data recorded.  LAB RESULTS: Recent Labs    11/16/17 2118 11/17/17 0215  WBC 15.1* 6.6  HGB 11.3* 11.1*  HCT 33.3* 33.7*  PLT 299 240   BMET Lab Results  Component Value Date   NA 138 11/17/2017   NA 137 11/16/2017   NA 137 11/09/2017   K 4.8 11/17/2017   K 4.3 11/16/2017   K 3.8 11/09/2017   CL 109 11/17/2017   CL 107 11/16/2017   CL 100 11/09/2017   CO2 20 (L) 11/17/2017   CO2 19 (L) 11/16/2017   CO2 23 11/09/2017   GLUCOSE 144 (H) 11/17/2017   GLUCOSE 215 (H) 11/16/2017   GLUCOSE 151 (H) 11/09/2017   BUN 31 (H) 11/17/2017   BUN 26 (H) 11/16/2017   BUN 17 11/09/2017   CREATININE 1.44 (H) 11/17/2017   CREATININE 1.78 (H) 11/16/2017   CREATININE 1.33 (H) 11/09/2017   CALCIUM 8.4 (L) 11/17/2017   CALCIUM 8.7 (L) 11/16/2017   CALCIUM 9.4 11/09/2017   LFT Recent Labs    11/16/17 2213 11/17/17 0215  PROT 6.0* 5.2*  ALBUMIN 3.2* 2.7*  AST 19 13*  ALT 17 14  ALKPHOS 57 41  BILITOT 0.8 0.5  BILIDIR 0.2  --   IBILI 0.6  --  PT/INR Lab Results  Component Value Date   INR 1.26 11/16/2017   Hepatitis Panel No results for input(s): HEPBSAG, HCVAB, HEPAIGM, HEPBIGM in the last 72 hours. C-Diff No components found for: CDIFF Lipase     Component Value Date/Time   LIPASE 31 11/09/2017 2114    Drugs of Abuse  No results found for: LABOPIA, COCAINSCRNUR, LABBENZ, AMPHETMU, THCU, LABBARB   RADIOLOGY STUDIES: No results found.    IMPRESSION:   *   Anemia.  Hgb drop 3 gm.  No trnasufusions  to date or indicated.   *   GI bleed.  Passing both red and dark, melenic type stool..  Diverticulosis on imaging.   Suspect UGI bleed given the history, Diflucan use, recent abdominal pain and anorexia, elevated BUN.  Diverticular bleed less likely but included in the differential diagnosis. Colon polyps on colonoscopy earlier this summer.   BID IV Pepcid in place.   No PPI at home.  Use of 81 ASA, Diclofenac at home mentioned in notes, not found on intake pharmacy documents.     *   Upper abd pain.   CT 1 week ago with sugg of cholecystitis, minor umbilical and left inguinal hernias.  LFTs, lipase normal  3 times in last 7 days.     *   AKI.     *   Dementia  *   DM2  *   Periph vasc dz.  CAD by CT.    PLAN:     *    Change to BID IV Protonix.  Pharm D to order.  Stop  Pepcid.     *   EGD.  If we do this this afternoon it would be with moderate sedation.  If the MD wishes to have MAC sedation, we would need to do the procedure tomorrow. For contact information of the wife, Torell Minder.  Her phone number is [phone]   Azucena Freed  11/17/2017, 8:26 AM Phone [phone]

## 2017-11-17 NOTE — ED Notes (Signed)
Per GI, PA [place] Dec Patient is scheduled for EGD this afternoon. Pt ok to have sips with chips. But keep NPO.

## 2017-11-17 NOTE — ED Notes (Signed)
Pt. Stating he needs to have a bowel movement. Pt. Placed on bed pan. Pt. Unable to have bowel movement. Will continue to monitor.

## 2017-11-17 NOTE — ED Notes (Addendum)
Bladder scan -200mL ?

## 2017-11-17 NOTE — ED Notes (Signed)
Pt. Asking for hospital bed. Pt. Placed in bed for comfort.

## 2017-11-17 NOTE — ED Notes (Signed)
Small amount of ice chips given to pt. Family updated about EGD procedure this afternoon.

## 2017-11-17 NOTE — Interval H&P Note (Signed)
History and Physical Interval Note:  11/17/2017 3:45 PM  Gabriel Orozco  has presented today for surgery, with the diagnosis of Passing dark bloody and bright red bloody bowel movements.  Anemia.  Abdominal pain.  The various methods of treatment have been discussed with the patient and family. After consideration of risks, benefits and other options for treatment, the patient has consented to  Procedure(s): ESOPHAGOGASTRODUODENOSCOPY (EGD) (N/A) as a surgical intervention .  The patient's history has been reviewed, patient examined, no change in status, stable for surgery.  I have reviewed the patient's chart and labs.  Questions were answered to the patient's satisfaction.     [name]

## 2017-11-17 NOTE — Evaluation (Signed)
Physical Therapy Evaluation Patient Details Name: Gabriel Orozco MRN: [id number] DOB: 1945-07-16 Today's Date: 11/17/2017   History of Present Illness  Pt is a 72 y/o male admitted secondary to abdominal pain and bloody stools. Thought to be secondary to GI bleed. Pt likely for EGD on 10/30 in afternoon per notes. PMH includes dementia, ETOH abuse, DM, HTN, and AAA.   Clinical Impression  Pt admitted secondary to problem above with deficits below. Pt pleasantly demented, so PLOF and home information obtained from pt's wife. Pt requiring min to min guard A for mobility this session. Mobility limited to gait at EOB as pt in ED and connected to monitors. Anticipate pt will progress well and be able to progress home with HHPT. Will continue to follow acutely to maximize functional mobility independence and safety.     Follow Up Recommendations Home health PT;Supervision/Assistance - 24 hour    Equipment Recommendations  Other (comment)(TBD pending mobility progression)    Recommendations for Other Services OT consult     Precautions / Restrictions Precautions Precautions: Fall Restrictions Weight Bearing Restrictions: No      Mobility  Bed Mobility Overal bed mobility: Needs Assistance Bed Mobility: Supine to Sit;Sit to Supine     Supine to sit: Min assist Sit to supine: Min assist   General bed mobility comments: Min A for sequencing assist to come to sitting at EOB and for return to supine. Min A for LE assist and trunk assist.   Transfers Overall transfer level: Needs assistance Equipment used: None Transfers: Sit to/from Stand Sit to Stand: Min guard         General transfer comment: Min guard for safety. performed transfer X2 this session.   Ambulation/Gait Ambulation/Gait assistance: Min guard Gait Distance (Feet): 3 Feet Assistive device: None Gait Pattern/deviations: Step-through pattern;Decreased stride length Gait velocity: Decreased    General Gait  Details: Slow, shaky gait at EOB. PT in ED and attached to monitors so mobility limited. Min guard for steadying assist for gait at EOB.   Stairs            Wheelchair Mobility    Modified Rankin (Stroke Patients Only)       Balance Overall balance assessment: Needs assistance Sitting-balance support: No upper extremity supported;Feet supported Sitting balance-Leahy Scale: Fair     Standing balance support: No upper extremity supported;During functional activity Standing balance-Leahy Scale: Fair                               Pertinent Vitals/Pain Pain Assessment: No/denies pain    Home Living Family/patient expects to be discharged to:: Private residence Living Arrangements: Spouse/significant other Available Help at Discharge: Family;Available 24 hours/day Type of Home: House Home Access: Stairs to enter Entrance Stairs-Rails: Right Entrance Stairs-Number of Steps: 3 Home Layout: One level Home Equipment: None Additional Comments: Home information gathered from wife as pt with dementia.     Prior Function Level of Independence: Independent               Hand Dominance        Extremity/Trunk Assessment   Upper Extremity Assessment Upper Extremity Assessment: Defer to OT evaluation    Lower Extremity Assessment Lower Extremity Assessment: Generalized weakness    Cervical / Trunk Assessment Cervical / Trunk Assessment: Normal  Communication   Communication: No difficulties  Cognition Arousal/Alertness: Awake/alert Behavior During Therapy: WFL for tasks assessed/performed Overall Cognitive Status: History of cognitive impairments -  at baseline                                 General Comments: Pleasantly demented at baseline.       General Comments General comments (skin integrity, edema, etc.): Pt's wife present in session.     Exercises     Assessment/Plan    PT Assessment Patient needs continued PT services   PT Problem List Decreased strength;Decreased balance;Decreased cognition;Decreased mobility;Decreased knowledge of use of DME;Decreased knowledge of precautions       PT Treatment Interventions DME instruction;Gait training;Functional mobility training;Therapeutic activities;Therapeutic exercise;Stair training;Balance training;Patient/family education;Cognitive remediation    PT Goals (Current goals can be found in the Care Plan section)  Acute Rehab PT Goals Patient Stated Goal: for pt to return home per wife PT Goal Formulation: With patient/family Time For Goal Achievement: 12/01/17 Potential to Achieve Goals: Good    Frequency Min 3X/week   Barriers to discharge        Co-evaluation               AM-PAC PT "6 Clicks" Daily Activity  Outcome Measure Difficulty turning over in bed (including adjusting bedclothes, sheets and blankets)?: A Little Difficulty moving from lying on back to sitting on the side of the bed? : Unable Difficulty sitting down on and standing up from a chair with arms (e.g., wheelchair, bedside commode, etc,.)?: Unable Help needed moving to and from a bed to chair (including a wheelchair)?: A Little Help needed walking in hospital room?: A Little Help needed climbing 3-5 steps with a railing? : A Lot 6 Click Score: 13    End of Session Equipment Utilized During Treatment: Gait belt Activity Tolerance: Patient tolerated treatment well Patient left: in bed;with call bell/phone within reach;with family/visitor present Nurse Communication: Mobility status;Other (comment)(BP cuff not working ) PT Visit Diagnosis: Unsteadiness on feet (R26.81);Muscle weakness (generalized) (M62.81)    Time: 1610-9604 PT Time Calculation (min) (ACUTE ONLY): 16 min   Charges:   PT Evaluation $PT Eval Low Complexity: 1 Low          Gladys Damme, PT, DPT  Acute Rehabilitation Services  Pager: [phone] Office: [phone]   Lehman Prom 11/17/2017, 2:18 PM

## 2017-11-18 LAB — GLUCOSE, CAPILLARY
GLUCOSE-CAPILLARY: 104 mg/dL — AB (ref 70–99)
GLUCOSE-CAPILLARY: 107 mg/dL — AB (ref 70–99)
Glucose-Capillary: 109 mg/dL — ABNORMAL HIGH (ref 70–99)
Glucose-Capillary: 95 mg/dL (ref 70–99)
Glucose-Capillary: 95 mg/dL (ref 70–99)
Glucose-Capillary: 114 mg/dL — ABNORMAL HIGH (ref 70–99)

## 2017-11-18 LAB — CBC
HCT: 27.1 % — ABNORMAL LOW (ref 39.0–52.0)
Hemoglobin: 8.9 g/dL — ABNORMAL LOW (ref 13.0–17.0)
MCH: 30.3 pg (ref 26.0–34.0)
MCHC: 32.8 g/dL (ref 30.0–36.0)
MCV: 92.2 fL (ref 80.0–100.0)
PLATELETS: 277 10*3/uL (ref 150–400)
RBC: 2.94 MIL/uL — ABNORMAL LOW (ref 4.22–5.81)
RDW: 12.4 % (ref 11.5–15.5)
WBC: 7.6 10*3/uL (ref 4.0–10.5)
nRBC: 0 % (ref 0.0–0.2)

## 2017-11-18 LAB — HEMOGLOBIN AND HEMATOCRIT, BLOOD
HCT: 25.6 % — ABNORMAL LOW (ref 39.0–52.0)
Hemoglobin: 8.6 g/dL — ABNORMAL LOW (ref 13.0–17.0)

## 2017-11-18 NOTE — Progress Notes (Signed)
Daily Rounding Note  11/18/2017, 9:42 AM  LOS: 2 days   SUBJECTIVE:   Chief complaint: black, bloody stool.   UGI bleed No stool overnight.  No N/V.  NPO Foley placed for urinary retention and he put out 1600 ml rapidly, put out total of 4.8 liters yesterday evening through this AM. Was having lower abdominal pain that resolved with foley placement.      OBJECTIVE:         Vital signs in last 24 hours:    Temp:  [97.5 F (36.4 C)-98.9 F (37.2 C)] 98.6 F (37 C) (10/31 0452) Pulse Rate:  [57-119] 79 (10/31 0452) Resp:  [12-22] 16 (10/31 0452) BP: (83-192)/(43-151) 133/75 (10/31 0452) SpO2:  [87 %-100 %] 97 % (10/31 0452) Last BM Date: 11/17/17 There were no vitals filed for this visit. General: NAD, alert, laying comfortably in bed.     Heart: RRR Chest: clear bil.  No labored breathing or cough Abdomen: NT, ND.  Active BS.  ND Foley cath in place  Extremities: no CCE Neuro/Psych:  Alert, expressive aphasia and not saying much.  Follows simple commands.    Intake/Output from previous day: 10/30 0701 - 10/31 0700 In: 1115 [P.O.:240; I.V.:875] Out: 4800 [Urine:4800]  Intake/Output this shift: No intake/output data recorded.  Lab Results: Recent Labs    11/16/17 2118 11/17/17 0215 11/18/17 0236  WBC 15.1* 6.6 7.9  HGB 11.3* 11.1* 8.7*  HCT 33.3* 33.7* 25.5*  PLT 299 240 249   BMET Recent Labs    11/16/17 2118 11/17/17 0215  NA 137 138  K 4.3 4.8  CL 107 109  CO2 19* 20*  GLUCOSE 215* 144*  BUN 26* 31*  CREATININE 1.78* 1.44*  CALCIUM 8.7* 8.4*   LFT Recent Labs    11/16/17 2213 11/17/17 0215  PROT 6.0* 5.2*  ALBUMIN 3.2* 2.7*  AST 19 13*  ALT 17 14  ALKPHOS 57 41  BILITOT 0.8 0.5  BILIDIR 0.2  --   IBILI 0.6  --    PT/INR Recent Labs    11/16/17 2213  LABPROT 15.6*  INR 1.26   Hepatitis Panel No results for input(s): HEPBSAG, HCVAB, HEPAIGM, HEPBIGM in the last 72  hours.  Studies/Results: No results found.   Scheduled Meds: . insulin aspart  0-9 Units Subcutaneous Q4H  . memantine  5 mg Oral BID  . pantoprazole (PROTONIX) IV  40 mg Intravenous Q12H  . QUEtiapine  100 mg Oral QHS  . simvastatin  20 mg Oral QHS   Continuous Infusions: . sodium chloride 125 mL/hr at 11/17/17 0955   PRN Meds:.acetaminophen **OR** acetaminophen, ondansetron **OR** ondansetron (ZOFRAN) IV   ASSESMENT:   *   Anemia.  Hgb 11.1 >> 8.7, was 14.6 on 10/22.  No trnasufusions to date or yet indicated.   *   GI bleed.  Red and dark, melenic type stool..  Diverticulosis on imaging.   Colon polyps, diverticulosis on colonoscopy earlier this summer.   81 ASA, Diclofenac and no PPI etc at home     10/30 EGD: non-bleeding DU with VV, treated with cautery ablation.  Duodenitis.  Clo bx obtained.  Day 2 Protonix 40 IV BID.  *   Upper abd pain.   CT 1 week ago with sugg of cholecystitis, minor umbilical and left inguinal hernias.  LFTs, lipase normal  3 times in last 7 days.  Suspect pain may be secondary to the DU.      *  AKI.   Urinary retention, foley placed.   BMET ordered.    *   Dementia  *   DM2  *   Periph vasc dz.  CAD by CT.     PLAN   *   Should not resume Diclofenac.  Stay on IV Protonix for now.     *   CBC and BMET in AM.    *   Allow PO clears, currently NPO.  IVF at 125/hour.      Gabriel Orozco  11/18/2017, 9:42 AM Phone [phone]

## 2017-11-18 NOTE — Evaluation (Signed)
Occupational Therapy Evaluation Patient Details Name: Gabriel Orozco MRN: [id number] DOB: 1945/08/03 Today's Date: 11/18/2017    History of Present Illness Pt is a 72 y/o male admitted secondary to abdominal pain and bloody stools. Thought to be secondary to GI bleed. Pt likely for EGD on 10/30 in afternoon per notes. PMH includes dementia, ETOH abuse, DM, HTN, and AAA.    Clinical Impression   Pt was able to perform self care with supervision and was assisted for IADL. He did not walk with a device. Pt presents with baseline impairment in cognition and mild unsteadiness in standing. He requires set up to min assist for ADL. Will follow acutely. Do not anticipate need for post acute OT.    Follow Up Recommendations  No OT follow up    Equipment Recommendations  None recommended by OT    Recommendations for Other Services       Precautions / Restrictions Precautions Precautions: Fall      Mobility Bed Mobility Overal bed mobility: Needs Assistance Bed Mobility: Supine to Sit;Sit to Supine     Supine to sit: Supervision Sit to supine: Supervision   General bed mobility comments: HOB up, no difficulty  Transfers Overall transfer level: Needs assistance Equipment used: None Transfers: Sit to/from Stand Sit to Stand: Min guard              Balance Overall balance assessment: Needs assistance   Sitting balance-Leahy Scale: Good Sitting balance - Comments: no LOB with donning sock     Standing balance-Leahy Scale: Fair                             ADL either performed or assessed with clinical judgement   ADL Overall ADL's : Needs assistance/impaired Eating/Feeding: Set up;Sitting   Grooming: Wash/dry hands;Minimal assistance;Standing   Upper Body Bathing: Supervision/ safety;Sitting   Lower Body Bathing: Min guard;Sit to/from stand   Upper Body Dressing : Set up;Supervision/safety;Sitting   Lower Body Dressing: Min guard;Sit to/from stand    Toilet Transfer: Min guard;Ambulation   Toileting- Clothing Manipulation and Hygiene: Minimal assistance;Sit to/from stand       Functional mobility during ADLs: Min guard       Vision Patient Visual Report: No change from baseline Additional Comments: spatial/perceptual deficits     Perception     Praxis      Pertinent Vitals/Pain Pain Assessment: No/denies pain     Hand Dominance Right   Extremity/Trunk Assessment Upper Extremity Assessment Upper Extremity Assessment: Overall WFL for tasks assessed   Lower Extremity Assessment Lower Extremity Assessment: Defer to PT evaluation       Communication Communication Communication: Expressive difficulties   Cognition Arousal/Alertness: Awake/alert Behavior During Therapy: WFL for tasks assessed/performed Overall Cognitive Status: History of cognitive impairments - at baseline                                 General Comments: Pleasantly demented at baseline.    General Comments       Exercises     Shoulder Instructions      Home Living Family/patient expects to be discharged to:: Private residence Living Arrangements: Spouse/significant other Available Help at Discharge: Family;Available 24 hours/day Type of Home: House Home Access: Stairs to enter [employer] of Steps: 3 Entrance Stairs-Rails: Right Home Layout: One level     Bathroom Shower/Tub: Walk-in shower  Bathroom Toilet: Handicapped height     Home Equipment: Bedside commode          Prior Functioning/Environment Level of Independence: Needs assistance  Gait / Transfers Assistance Needed: walks without a device ADL's / Homemaking Assistance Needed: assisted for IADL, pt performs self care with supervision            OT Problem List: Impaired balance (sitting and/or standing);Decreased cognition;Decreased safety awareness      OT Treatment/Interventions: Self-care/ADL training;Patient/family  education;DME and/or AE instruction    OT Goals(Current goals can be found in the care plan section) Acute Rehab OT Goals Patient Stated Goal: for pt to return home per wife OT Goal Formulation: With patient Time For Goal Achievement: 12/02/17 Potential to Achieve Goals: Good ADL Goals Pt Will Perform Grooming: with supervision;standing Pt Will Perform Lower Body Bathing: with supervision;sit to/from stand Pt Will Perform Lower Body Dressing: with supervision;sit to/from stand Pt Will Transfer to Toilet: with supervision;ambulating Pt Will Perform Toileting - Clothing Manipulation and hygiene: with supervision;sit to/from stand  OT Frequency: Min 2X/week   Barriers to D/C:            Co-evaluation              AM-PAC PT "6 Clicks" Daily Activity     Outcome Measure Help from another person eating meals?: A Little Help from another person taking care of personal grooming?: A Little Help from another person toileting, which includes using toliet, bedpan, or urinal?: A Little Help from another person bathing (including washing, rinsing, drying)?: A Little Help from another person to put on and taking off regular upper body clothing?: A Little Help from another person to put on and taking off regular lower body clothing?: A Little 6 Click Score: 18   End of Session Equipment Utilized During Treatment: Gait belt  Activity Tolerance: Patient tolerated treatment well Patient left: in bed;with call bell/phone within reach;with family/visitor present  OT Visit Diagnosis: Unsteadiness on feet (R26.81);Other abnormalities of gait and mobility (R26.89);Muscle weakness (generalized) (M62.81);Cognitive communication deficit (R41.841)                Time: 8756-4332 OT Time Calculation (min): 26 min Charges:  OT General Charges $OT Visit: 1 Visit OT Evaluation $OT Eval Moderate Complexity: 1 Mod OT Treatments $Self Care/Home Management : 8-22 mins  Martie Round, OTR/L Acute  Rehabilitation Services Pager: [phone] Office: [phone]  Evern Bio 11/18/2017, 2:17 PM

## 2017-11-18 NOTE — Progress Notes (Signed)
Physical Therapy Treatment Patient Details Name: Gabriel Orozco MRN: [id number] DOB: [birth year] Today's Date: 11/18/2017    History of Present Illness Pt is a 72 y/o male admitted secondary to abdominal pain and bloody stools. Thought to be secondary to GI bleed. Pt likely for EGD on 10/30 in afternoon per notes. PMH includes dementia, ETOH abuse, DM, HTN, and AAA.     PT Comments    Patient is progressing very well towards their physical therapy goals. Increased ambulation distance to 200 feet with no assistive device and min guard assist secondary to mild imbalance. Pt wife declining pt use of external support (i.e. Walker), and I agree with this due to patient's baseline cognitive impairments. Pt wife able to provide 24/7 assistance at home and is very supportive. D/c plan remains appropriate.    Follow Up Recommendations  Home health PT;Supervision/Assistance - 24 hour     Equipment Recommendations  None recommended by PT    Recommendations for Other Services OT consult     Precautions / Restrictions Precautions Precautions: Fall Restrictions Weight Bearing Restrictions: No    Mobility  Bed Mobility Overal bed mobility: Needs Assistance Bed Mobility: Supine to Sit;Sit to Supine     Supine to sit: Min assist Sit to supine: Min assist   General bed mobility comments: Min A for sequencing assist to come to sitting at EOB and for return to supine.   Transfers Overall transfer level: Needs assistance Equipment used: None Transfers: Sit to/from Stand Sit to Stand: Min guard         General transfer comment: Min guard for safety.   Ambulation/Gait Ambulation/Gait assistance: Min guard Gait Distance (Feet): 200 Feet Assistive device: None Gait Pattern/deviations: Step-through pattern;Decreased stride length Gait velocity: Decreased    General Gait Details: Slow, mildly unsteady gait, requiring min guard assist. Noted right arm in high guard position. Directional  cues and cues for self pacing provided   Stairs             Wheelchair Mobility    Modified Rankin (Stroke Patients Only)       Balance Overall balance assessment: Needs assistance Sitting-balance support: No upper extremity supported;Feet supported Sitting balance-Leahy Scale: Fair Sitting balance - Comments: no LOB with donning sock   Standing balance support: No upper extremity supported;During functional activity Standing balance-Leahy Scale: Fair                              Cognition Arousal/Alertness: Awake/alert Behavior During Therapy: WFL for tasks assessed/performed Overall Cognitive Status: History of cognitive impairments - at baseline                                 General Comments: Pleasantly demented at baseline.       Exercises      General Comments        Pertinent Vitals/Pain Pain Assessment: No/denies pain    Home Living Family/patient expects to be discharged to:: Private residence Living Arrangements: Spouse/significant other Available Help at Discharge: Family;Available 24 hours/day Type of Home: House Home Access: Stairs to enter Entrance Stairs-Rails: Right Home Layout: One level Home Equipment: Bedside commode      Prior Function Level of Independence: Needs assistance  Gait / Transfers Assistance Needed: walks without a device ADL's / Homemaking Assistance Needed: assisted for IADL, pt performs self care with supervision     PT  Goals (current goals can now be found in the care plan section) Acute Rehab PT Goals Patient Stated Goal: for pt to return home per wife PT Goal Formulation: With patient/family Time For Goal Achievement: 12/01/17 Potential to Achieve Goals: Good Progress towards PT goals: Progressing toward goals    Frequency    Min 3X/week      PT Plan Current plan remains appropriate    Co-evaluation              AM-PAC PT "6 Clicks" Daily Activity  Outcome  Measure  Difficulty turning over in bed (including adjusting bedclothes, sheets and blankets)?: A Little Difficulty moving from lying on back to sitting on the side of the bed? : Unable Difficulty sitting down on and standing up from a chair with arms (e.g., wheelchair, bedside commode, etc,.)?: Unable Help needed moving to and from a bed to chair (including a wheelchair)?: A Little Help needed walking in hospital room?: A Little Help needed climbing 3-5 steps with a railing? : A Lot 6 Click Score: 13    End of Session Equipment Utilized During Treatment: Gait belt Activity Tolerance: Patient tolerated treatment well Patient left: in bed;with call bell/phone within reach;with family/visitor present Nurse Communication: Mobility status PT Visit Diagnosis: Unsteadiness on feet (R26.81);Muscle weakness (generalized) (M62.81)     Time: 1610-9604 PT Time Calculation (min) (ACUTE ONLY): 17 min  Charges:  $Therapeutic Activity: 8-22 mins                     Gabriel Orozco, PT, DPT Acute Rehabilitation Services Pager [phone] Office [phone]   Gabriel Orozco 11/18/2017, 3:06 PM

## 2017-11-18 NOTE — Progress Notes (Signed)
PROGRESS NOTE    Gabriel Orozco  BJY:[id number] DOB: November 25, 1945 DOA: 11/16/2017 PCP: [facility]  Outpatient Specialists:   Brief Narrative:  Patient is a 72 year old male with past medical history significant for dementia, diabetes mellitus type 2, hypertension, hyperlipidemia and diverticulosis.  Patient was admitted with dark-colored rectal bleed.  GI input is appreciated.  EGD is planned.  Hemoglobin as of 11/09/2017 was 14.6 g/dL, but dropped to 08.6 g/dL on presentation (57/84/6962).  No acute history of NSAID use, Goody powder use or steroid use.  Patient is only on aspirin.  11/18/2017: Patient seen alongside patient's wife.  Hemoglobin has remained stable at 8.9 g/dL.  No further bleeding reported.  We will continue to check H/H twice daily.  GI input is highly appreciated.  Continue PPIs for now.  Assessment & Plan:   Active Problems:   Lower GI bleed   Dementia (HCC)   Essential hypertension   Hyperlipidemia   Type 2 diabetes mellitus with hyperlipidemia (HCC)   Acute GI bleeding   Duodenal ulcer  Upper GI bleed: EGD revealed duodenal ulcer with visible vessel and duodenitis. GI input is highly appreciated. Continue to monitor H/H. Hemoglobin is stable  Dementia (HCC) Supportive care.    Essential hypertension: Stable.   Last blood pressure was 143/73 mmHg.   Continue to optimize. Antihypertensives are currently on hold.   Managed expectantly.  Hyperlipidemia: Stable. Continue current medications  Type 2 diabetes mellitus: Continue sliding scale insulin coverage.      DVT prophylaxis: SCD Code Status: DO NOT RESUSCITATE Family Communication: Wife  Disposition Plan: Will depend on hospital course   Consultants:   GI  Procedures:   EGD revealed duodenal ulcer with visible vessel and duodenitis.  Antimicrobials:   None   Subjective: Patient looks much better today.   No new complaints.   No further bleeding reported.   No chest  pain, no shortness of breath, no palpitations.    Objective: Vitals:   11/17/17 2350 11/18/17 0452 11/18/17 1024 11/18/17 1245  BP: (!) 126/59 133/75 139/61 (!) 143/73  Pulse: (!) 57 79  87  Resp: 12 16 18 15   Temp: 98.9 F (37.2 C) 98.6 F (37 C) 97.6 F (36.4 C) 98.1 F (36.7 C)  TempSrc: Oral Oral Oral Oral  SpO2: 98% 97%  98%  Height:        Intake/Output Summary (Last 24 hours) at 11/18/2017 1427 Last data filed at 11/18/2017 1244 Gross per 24 hour  Intake 1115 ml  Output 4800 ml  Net -3685 ml   There were no vitals filed for this visit.  Examination:  General exam: Appears calm and comfortable  Respiratory system: Clear to auscultation. Respiratory effort normal. Cardiovascular system: S1 & S2 heard, RRR. No pedal edema. Gastrointestinal system: Abdomen is nondistended, soft and nontender. No organomegaly or masses felt. Normal bowel sounds heard. Central nervous system: Sleepy from the effect of medications.  Seems to move all extremities.   Extremities: No leg edema.     Data Reviewed: I have personally reviewed following labs and imaging studies  CBC: Recent Labs  Lab 11/16/17 2118 11/17/17 0215 11/18/17 0236 11/18/17 0834  WBC 15.1* 6.6 7.9 7.6  HGB 11.3* 11.1* 8.7* 8.9*  HCT 33.3* 33.7* 25.5* 27.1*  MCV 92.5 93.1 91.7 92.2  PLT 299 240 249 277   Basic Metabolic Panel: Recent Labs  Lab 11/16/17 2118 11/17/17 0215  NA 137 138  K 4.3 4.8  CL 107 109  CO2 19*  20*  GLUCOSE 215* 144*  BUN 26* 31*  CREATININE 1.78* 1.44*  CALCIUM 8.7* 8.4*  MG  --  1.4*  PHOS  --  1.9*   GFR: Estimated Creatinine Clearance: 55.6 mL/min (A) (by C-G formula based on SCr of 1.44 mg/dL (H)). Liver Function Tests: Recent Labs  Lab 11/16/17 2213 11/17/17 0215  AST 19 13*  ALT 17 14  ALKPHOS 57 41  BILITOT 0.8 0.5  PROT 6.0* 5.2*  ALBUMIN 3.2* 2.7*   No results for input(s): LIPASE, AMYLASE in the last 168 hours. No results for input(s): AMMONIA in the  last 168 hours. Coagulation Profile: Recent Labs  Lab 11/16/17 2213  INR 1.26   Cardiac Enzymes: Recent Labs  Lab 11/17/17 0213 11/17/17 0759 11/17/17 1425  TROPONINI <0.03 <0.03 <0.03   BNP (last 3 results) No results for input(s): PROBNP in the last 8760 hours. HbA1C: Recent Labs    11/17/17 0213  HGBA1C 6.6*   CBG: Recent Labs  Lab 11/17/17 1951 11/17/17 2346 11/18/17 0450 11/18/17 0752 11/18/17 1110  GLUCAP 113* 104* 109* 95 95   Lipid Profile: No results for input(s): CHOL, HDL, LDLCALC, TRIG, CHOLHDL, LDLDIRECT in the last 72 hours. Thyroid Function Tests: Recent Labs    11/17/17 0213  TSH 2.955   Anemia Panel: No results for input(s): VITAMINB12, FOLATE, FERRITIN, TIBC, IRON, RETICCTPCT in the last 72 hours. Urine analysis:    Component Value Date/Time   COLORURINE YELLOW 11/17/2017 0510   APPEARANCEUR CLEAR 11/17/2017 0510   LABSPEC 1.019 11/17/2017 0510   PHURINE 5.0 11/17/2017 0510   GLUCOSEU 50 (A) 11/17/2017 0510   HGBUR NEGATIVE 11/17/2017 0510   BILIRUBINUR NEGATIVE 11/17/2017 0510   KETONESUR 5 (A) 11/17/2017 0510   PROTEINUR NEGATIVE 11/17/2017 0510   NITRITE NEGATIVE 11/17/2017 0510   LEUKOCYTESUR NEGATIVE 11/17/2017 0510   Sepsis Labs: @LABRCNTIP (procalcitonin:4,lacticidven:4)  )No results found for this or any previous visit (from the past 240 hour(s)).       Radiology Studies: No results found.      Scheduled Meds: . insulin aspart  0-9 Units Subcutaneous Q4H  . memantine  5 mg Oral BID  . pantoprazole (PROTONIX) IV  40 mg Intravenous Q12H  . QUEtiapine  100 mg Oral QHS  . simvastatin  20 mg Oral QHS   Continuous Infusions: . sodium chloride 125 mL/hr at 11/18/17 1228     LOS: 2 days    Time spent: 25 minutes.    Berton Mount, MD  [facility] Pager #: [phone] 7PM-7AM contact night coverage as above

## 2017-11-19 ENCOUNTER — Inpatient Hospital Stay (HOSPITAL_COMMUNITY): Payer: No Typology Code available for payment source

## 2017-11-19 DIAGNOSIS — K922 Gastrointestinal hemorrhage, unspecified: Secondary | ICD-10-CM

## 2017-11-19 DIAGNOSIS — I10 Essential (primary) hypertension: Secondary | ICD-10-CM

## 2017-11-19 DIAGNOSIS — F0390 Unspecified dementia without behavioral disturbance: Secondary | ICD-10-CM

## 2017-11-19 DIAGNOSIS — K269 Duodenal ulcer, unspecified as acute or chronic, without hemorrhage or perforation: Secondary | ICD-10-CM

## 2017-11-19 LAB — GLUCOSE, CAPILLARY
GLUCOSE-CAPILLARY: 111 mg/dL — AB (ref 70–99)
GLUCOSE-CAPILLARY: 121 mg/dL — AB (ref 70–99)
Glucose-Capillary: 102 mg/dL — ABNORMAL HIGH (ref 70–99)
Glucose-Capillary: 113 mg/dL — ABNORMAL HIGH (ref 70–99)
Glucose-Capillary: 158 mg/dL — ABNORMAL HIGH (ref 70–99)
Glucose-Capillary: 146 mg/dL — ABNORMAL HIGH (ref 70–99)

## 2017-11-19 LAB — CBC
HCT: 25.5 % — ABNORMAL LOW (ref 39.0–52.0)
HEMATOCRIT: 26.4 % — AB (ref 39.0–52.0)
Hemoglobin: 8.7 g/dL — ABNORMAL LOW (ref 13.0–17.0)
Hemoglobin: 9.2 g/dL — ABNORMAL LOW (ref 13.0–17.0)
MCH: 31.3 pg (ref 26.0–34.0)
MCH: 31.5 pg (ref 26.0–34.0)
MCHC: 34.1 g/dL (ref 30.0–36.0)
MCHC: 34.8 g/dL (ref 30.0–36.0)
MCV: 91.7 fL (ref 80.0–100.0)
MCV: 90.4 fL (ref 80.0–100.0)
PLATELETS: 249 10*3/uL (ref 150–400)
PLATELETS: 281 10*3/uL (ref 150–400)
RBC: 2.78 MIL/uL — AB (ref 4.22–5.81)
RBC: 2.92 MIL/uL — ABNORMAL LOW (ref 4.22–5.81)
RDW: 12.3 % (ref 11.5–15.5)
RDW: 12.4 % (ref 11.5–15.5)
WBC: 7.9 10*3/uL (ref 4.0–10.5)
WBC: 13.1 10*3/uL — ABNORMAL HIGH (ref 4.0–10.5)
nRBC: 0 % (ref 0.0–0.2)
nRBC: 0 % (ref 0.0–0.2)

## 2017-11-19 LAB — BASIC METABOLIC PANEL
Anion gap: 9 (ref 5–15)
BUN: 8 mg/dL (ref 8–23)
CALCIUM: 8.6 mg/dL — AB (ref 8.9–10.3)
CO2: 21 mmol/L — AB (ref 22–32)
CREATININE: 1.09 mg/dL (ref 0.61–1.24)
Chloride: 107 mmol/L (ref 98–111)
GFR calc Af Amer: >60 mL/min (ref >60)
Glucose, Bld: 115 mg/dL — ABNORMAL HIGH (ref 70–99)
Potassium: 3 mmol/L — ABNORMAL LOW (ref 3.5–5.1)
Sodium: 137 mmol/L (ref 135–145)

## 2017-11-19 LAB — MAGNESIUM: Magnesium: 1.1 mg/dL — ABNORMAL LOW (ref 1.7–2.4)

## 2017-11-19 MED ORDER — TAMSULOSIN HCL 0.4 MG PO CAPS
0.4000 mg | ORAL_CAPSULE | Freq: Every day | ORAL | Status: DC
  Administered 2017-11-19 – 2017-11-22 (×4): 0.4 mg via ORAL
  Filled 2017-11-19 (×4): qty 1

## 2017-11-19 MED ORDER — MAGNESIUM SULFATE 4 GM/100ML IV SOLN
4.0000 g | Freq: Once | INTRAVENOUS | Status: AC
  Administered 2017-11-19: 4 g via INTRAVENOUS
  Filled 2017-11-19: qty 100

## 2017-11-19 MED ORDER — POTASSIUM CHLORIDE 10 MEQ/100ML IV SOLN
10.0000 meq | INTRAVENOUS | Status: AC
  Administered 2017-11-19 (×6): 10 meq via INTRAVENOUS
  Filled 2017-11-19 (×4): qty 100

## 2017-11-19 MED ORDER — PANTOPRAZOLE SODIUM 40 MG IV SOLR
40.0000 mg | Freq: Two times a day (BID) | INTRAVENOUS | Status: AC
  Administered 2017-11-19 – 2017-11-20 (×2): 40 mg via INTRAVENOUS
  Filled 2017-11-19 (×2): qty 40

## 2017-11-19 MED ORDER — PANTOPRAZOLE SODIUM 40 MG PO TBEC: 40.0000 mg | DELAYED_RELEASE_TABLET | Freq: Two times a day (BID) | ORAL | Status: DC

## 2017-11-19 NOTE — Progress Notes (Addendum)
Daily Rounding Note  11/19/2017, 10:23 AM  LOS: 3 days   SUBJECTIVE:   Chief complaint:  GI bleed due to DU  No abdominal pain. No BM's since DOA.  No nausea or vomiting ever.  Eating poorly.  Food/meds just sitiing in mouth for a long time before he swallows, wife says this has been an issue at home as well.   Started wet, ronchorous coughing 2 d ago. Sputum yellow.     Foley output 4350 ml yesterday.    OBJECTIVE:         Vital signs in last 24 hours:    Temp:  [97.5 F (36.4 C)-98.1 F (36.7 C)] 97.5 F (36.4 C) (11/01 0407) Pulse Rate:  [87-96] 96 (11/01 0407) Resp:  [13-22] 13 (11/01 0407) BP: (129-143)/(61-73) 129/63 (11/01 0407) SpO2:  [98 %-99 %] 99 % (11/01 0407) Last BM Date: 11/17/17 There were no vitals filed for this visit. General: calm, not much to say.  Does not look toxic Heart: RRR Chest: upper airway ronchi audible throughout.  Very loose mucoid cough.   Abdomen: soft, NT, NT.  Active BS  Extremities: no CCE. Neuro/Psych:  Oriented to self and family. Requires repeated requests and instructions to perform simple tasks such as taking breaths for lung exam.  Calm.    Intake/Output from previous day: 10/31 0701 - 11/01 0700 In: 3302.1 [P.O.:360; I.V.:2942.1] Out: 4350 [Urine:4350]  Intake/Output this shift: No intake/output data recorded.  Lab Results: Recent Labs    11/18/17 0236 11/18/17 0834 11/18/17 1458 11/19/17 0313  WBC 7.9 7.6  --  13.1*  HGB 8.7* 8.9* 8.6* 9.2*  HCT 25.5* 27.1* 25.6* 26.4*  PLT 249 277  --  281   BMET Recent Labs    11/16/17 2118 11/17/17 0215 11/19/17 0313  NA 137 138 137  K 4.3 4.8 3.0*  CL 107 109 107  CO2 19* 20* 21*  GLUCOSE 215* 144* 115*  BUN 26* 31* 8  CREATININE 1.78* 1.44* 1.09  CALCIUM 8.7* 8.4* 8.6*   LFT Recent Labs    11/16/17 2213 11/17/17 0215  PROT 6.0* 5.2*  ALBUMIN 3.2* 2.7*  AST 19 13*  ALT 17 14  ALKPHOS 57 41  BILITOT  0.8 0.5  BILIDIR 0.2  --   IBILI 0.6  --    PT/INR Recent Labs    11/16/17 2213  LABPROT 15.6*  INR 1.26   Scheduled Meds: . insulin aspart  0-9 Units Subcutaneous Q4H  . memantine  5 mg Oral BID  . pantoprazole (PROTONIX) IV  40 mg Intravenous Q12H  . QUEtiapine  100 mg Oral QHS  . simvastatin  20 mg Oral QHS   Continuous Infusions: . sodium chloride 125 mL/hr at 11/19/17 0544  . potassium chloride 10 mEq (11/19/17 0931)   PRN Meds:.acetaminophen **OR** acetaminophen, ondansetron **OR** ondansetron (ZOFRAN) IV   ASSESMENT:   * Anemia. Hgb 11.1 >> 8.7 >> 9.2, was 14.6 on 10/22. No trnasufusions to date.    *GI bleed from  DU. Red/dark, melenic type stool..  Colon polyps, diverticulosis on colonoscopy earlier this summer.  81 ASA, Diclofenac and no PPI etc at home   10/30 EGD: non-bleeding DU with VV, treated with cautery ablation.  Duodenitis.  Clo bx obtained.  Day 2 Protonix 40 IV BID.  * Upper abd pain. CT 1 week ago with sugg of cholecystitis,minor umbilical and left inguinal hernias.LFTs, lipase normal 3 times in last 7 days.  no clinical indication of cholecystitis.   Suspect pain may be secondary to the DU.    *  New cough.  WBCs 7.6 >> 13.1.  No fever.  ?aspiration during EGD?   * AKI. resolved.   Urinary retention, foley placed.  BMET ordered.    *   Hypokalemia.  Runs of potassium ordered.    * Dementia   PLAN   *   Finish BID IV Protonix tomorrow evening 11/2.  Start BID PO Protonix 40 on 11/3.  Hgb/Hct in AM  *   2V CXR to assess for PNA.  SLP eval to assess for dysphagia.  Diet to CM if cleared by SLP.     Gabriel Orozco  11/19/2017, 10:23 AM Phone [phone]

## 2017-11-19 NOTE — Progress Notes (Addendum)
PROGRESS NOTE    Gabriel Orozco  ZOX:[id number] DOB: 05-25-1945 DOA: 11/16/2017 PCP: [facility]   Brief Narrative:  Patient is a 72 year old male with past medical history significant for dementia, diabetes mellitus type 2, hypertension, hyperlipidemia and diverticulosis.  Patient was admitted with dark-colored rectal bleed.    Gastroenterology consulted, status post EGD which showed non-bleeding diverticular ulcer.  Globin currently stable.  Assessment & Plan   GI bleed/acute on chronic anemia/acute blood loss anemia -Hemoglobin is 14.6 on 11/09/2017, currently 9.2 -Patient has had dark melanotic stools -He has had colon polyps as well as diverticulosis on colonoscopy earlier this year -Has also been taking NSAIDs as well as aspirin at home -Gastroenterology consulted and appreciated -Status post EGD showing nonbleeding duodenal ulcer with visible vessel.  Treated with gold probe to ablate the vessel.  Also has duodenitis.  Biopsied for H. pylori rule out -Gastroenterology recommended continuing IV Protonix through tomorrow and possibly transitioning to oral Protonix 40 mg twice daily tomorrow afternoon or evening.  Patient may resume a baby aspirin approximately 1 week.  Upper abdominal pain -Possibly secondary to duodenal ulcer -CT scan done 1 week ago was suggestive of cholecystitis, minor umbilical and left inguinal hernias. -LFTs appear to be normal -Treat symptomatically  Urinary retention -Foley catheter was placed however causing patient irritation and family requesting to have it removed -Flomax started -Discussed with nurse, monitor intake and output strictly.  If no urine output within a few hours of Foley removal, bladder scan to be done for possible replacement of Foley catheter.  Acute kidney injury -Resolved, creatinine on admission 1.78, currently down to 1.09 -Possibly secondary to urinary retention vs medications -Continue to monitor  BMP  Cough -Concern for aspiration -Chest x-ray ordered  Diabetes mellitus, type II -Hemoglobin A1c 6.6 -metformin held -BG's appear to be controlled  Essential hypertension -Amlodipine Cozaar held, currently BP is stable  Hyperlipidemia -Continue statin  Dementia -Continue Namenda  Hypokalemia/hypomagnesemia -Will replace and continue to monitor closely  Deconditioning -PT recommended home health -OT evaluated patient, no further recommendations needed  DVT Prophylaxis  SCDs  Code Status: DNR  Family Communication: Wife at bedside  Disposition Plan: Admitted. Suspect discharge to home with home health on 11/20/2017  Consultants Gastroenterology  Procedures  EGD  Antibiotics   Anti-infectives (From admission, onward)   None      Subjective:   Gabriel Orozco seen and examined today.  Really no complaints.  Patient with history of dementia.  Per wife, patient has attempted to pull out his Foley catheter a few times overnight.    Objective:   Vitals:   11/19/17 0500 11/19/17 0700 11/19/17 0900 11/19/17 1100  BP:      Pulse:      Resp: 17 15 20 20   Temp:      TempSrc:      SpO2:      Height:        Intake/Output Summary (Last 24 hours) at 11/19/2017 1432 Last data filed at 11/19/2017 1330 Gross per 24 hour  Intake 3542.13 ml  Output 3050 ml  Net 492.13 ml   There were no vitals filed for this visit.  Exam  General: Well developed, well nourished, NAD, appears stated age  HEENT: NCAT, mucous membranes moist.   Neck: Supple  Cardiovascular: S1 S2 auscultated, RRR, no murmur  Respiratory: Upper airway congestion  Abdomen: Soft, nontender, nondistended, + bowel sounds  Extremities: warm dry without cyanosis clubbing or edema  Neuro: AAO to self  and family, dementia, nonfocal  Skin: Without rashes exudates or nodules  Psych: Normal affect and demeanor with intact judgement and insight   Data Reviewed: I have personally reviewed  following labs and imaging studies  CBC: Recent Labs  Lab 11/16/17 2118 11/17/17 0215 11/18/17 0236 11/18/17 0834 11/18/17 1458 11/19/17 0313  WBC 15.1* 6.6 7.9 7.6  --  13.1*  HGB 11.3* 11.1* 8.7* 8.9* 8.6* 9.2*  HCT 33.3* 33.7* 25.5* 27.1* 25.6* 26.4*  MCV 92.5 93.1 91.7 92.2  --  90.4  PLT 299 240 249 277  --  281   Basic Metabolic Panel: Recent Labs  Lab 11/16/17 2118 11/17/17 0215 11/19/17 0313  NA 137 138 137  K 4.3 4.8 3.0*  CL 107 109 107  CO2 19* 20* 21*  GLUCOSE 215* 144* 115*  BUN 26* 31* 8  CREATININE 1.78* 1.44* 1.09  CALCIUM 8.7* 8.4* 8.6*  MG  --  1.4* 1.1*  PHOS  --  1.9*  --    GFR: Estimated Creatinine Clearance: 73.4 mL/min (by C-G formula based on SCr of 1.09 mg/dL). Liver Function Tests: Recent Labs  Lab 11/16/17 2213 11/17/17 0215  AST 19 13*  ALT 17 14  ALKPHOS 57 41  BILITOT 0.8 0.5  PROT 6.0* 5.2*  ALBUMIN 3.2* 2.7*   No results for input(s): LIPASE, AMYLASE in the last 168 hours. No results for input(s): AMMONIA in the last 168 hours. Coagulation Profile: Recent Labs  Lab 11/16/17 2213  INR 1.26   Cardiac Enzymes: Recent Labs  Lab 11/17/17 0213 11/17/17 0759 11/17/17 1425  TROPONINI <0.03 <0.03 <0.03   BNP (last 3 results) No results for input(s): PROBNP in the last 8760 hours. HbA1C: Recent Labs    11/17/17 0213  HGBA1C 6.6*   CBG: Recent Labs  Lab 11/18/17 2137 11/19/17 0021 11/19/17 0404 11/19/17 0821 11/19/17 1115  GLUCAP 114* 102* 111* 113* 121*   Lipid Profile: No results for input(s): CHOL, HDL, LDLCALC, TRIG, CHOLHDL, LDLDIRECT in the last 72 hours. Thyroid Function Tests: Recent Labs    11/17/17 0213  TSH 2.955   Anemia Panel: No results for input(s): VITAMINB12, FOLATE, FERRITIN, TIBC, IRON, RETICCTPCT in the last 72 hours. Urine analysis:    Component Value Date/Time   COLORURINE YELLOW 11/17/2017 0510   APPEARANCEUR CLEAR 11/17/2017 0510   LABSPEC 1.019 11/17/2017 0510   PHURINE 5.0  11/17/2017 0510   GLUCOSEU 50 (A) 11/17/2017 0510   HGBUR NEGATIVE 11/17/2017 0510   BILIRUBINUR NEGATIVE 11/17/2017 0510   KETONESUR 5 (A) 11/17/2017 0510   PROTEINUR NEGATIVE 11/17/2017 0510   NITRITE NEGATIVE 11/17/2017 0510   LEUKOCYTESUR NEGATIVE 11/17/2017 0510   Sepsis Labs: @LABRCNTIP (procalcitonin:4,lacticidven:4)  )No results found for this or any previous visit (from the past 240 hour(s)).    Radiology Studies: No results found.   Scheduled Meds: . insulin aspart  0-9 Units Subcutaneous Q4H  . memantine  5 mg Oral BID  . [START ON 11/21/2017] pantoprazole  40 mg Oral BID  . pantoprazole (PROTONIX) IV  40 mg Intravenous Q12H  . QUEtiapine  100 mg Oral QHS  . simvastatin  20 mg Oral QHS  . tamsulosin  0.4 mg Oral Daily   Continuous Infusions: . sodium chloride 125 mL/hr at 11/19/17 1349  . magnesium sulfate 1 - 4 g bolus IVPB    . potassium chloride 10 mEq (11/19/17 1348)     LOS: 3 days   Time Spent in minutes   30 minutes  [employer]  D.O. on 11/19/2017 at 2:32 PM  Between 7am to 7pm - Please see pager noted on [email or web address]  After 7pm go to [email or web address]  And look for the night coverage person covering for me after hours  [facility] Office  [phone]

## 2017-11-19 NOTE — Care Management Important Message (Signed)
Important Message  Patient Details  Name: Gabriel Orozco MRN: [id number] Date of Birth: 03-Nov-1945   Medicare Important Message Given:  Yes    Oralia Rud Melvyn Hommes 11/19/2017, 3:49 PM

## 2017-11-19 NOTE — Plan of Care (Signed)
  Problem: Education: Goal: Knowledge of General Education information will improve Description Including pain rating scale, medication(s)/side effects and non-pharmacologic comfort measures Outcome: Progressing   Problem: Clinical Measurements: Goal: Respiratory complications will improve Outcome: Progressing Goal: Cardiovascular complication will be avoided Outcome: Progressing   Problem: Coping: Goal: Level of anxiety will decrease Outcome: Progressing   Problem: Health Behavior/Discharge Planning: Goal: Ability to manage health-related needs will improve Outcome: Not Progressing   Problem: Safety: Goal: Ability to remain free from injury will improve Outcome: Not Progressing

## 2017-11-20 LAB — CBC
HEMATOCRIT: 24 % — AB (ref 39.0–52.0)
Hemoglobin: 8 g/dL — ABNORMAL LOW (ref 13.0–17.0)
MCH: 30.4 pg (ref 26.0–34.0)
MCHC: 33.3 g/dL (ref 30.0–36.0)
MCV: 91.3 fL (ref 80.0–100.0)
Platelets: 290 10*3/uL (ref 150–400)
RBC: 2.63 MIL/uL — ABNORMAL LOW (ref 4.22–5.81)
RDW: 12.7 % (ref 11.5–15.5)
WBC: 16.3 10*3/uL — AB (ref 4.0–10.5)
nRBC: 0 % (ref 0.0–0.2)

## 2017-11-20 LAB — HEMOGLOBIN AND HEMATOCRIT, BLOOD
HCT: 24.4 % — ABNORMAL LOW (ref 39.0–52.0)
Hemoglobin: 8 g/dL — ABNORMAL LOW (ref 13.0–17.0)

## 2017-11-20 LAB — BASIC METABOLIC PANEL
Anion gap: 3 — ABNORMAL LOW (ref 5–15)
BUN: 9 mg/dL (ref 8–23)
CHLORIDE: 110 mmol/L (ref 98–111)
CO2: 23 mmol/L (ref 22–32)
CREATININE: 1.19 mg/dL (ref 0.61–1.24)
Calcium: 8.1 mg/dL — ABNORMAL LOW (ref 8.9–10.3)
GFR calc Af Amer: >60 mL/min (ref >60)
GFR calc non Af Amer: 60 mL/min — ABNORMAL LOW (ref >60)
Glucose, Bld: 111 mg/dL — ABNORMAL HIGH (ref 70–99)
Potassium: 3.2 mmol/L — ABNORMAL LOW (ref 3.5–5.1)
SODIUM: 136 mmol/L (ref 135–145)

## 2017-11-20 LAB — GLUCOSE, CAPILLARY
GLUCOSE-CAPILLARY: 127 mg/dL — AB (ref 70–99)
GLUCOSE-CAPILLARY: 161 mg/dL — AB (ref 70–99)
GLUCOSE-CAPILLARY: 98 mg/dL (ref 70–99)
Glucose-Capillary: 132 mg/dL — ABNORMAL HIGH (ref 70–99)
Glucose-Capillary: 160 mg/dL — ABNORMAL HIGH (ref 70–99)

## 2017-11-20 LAB — MAGNESIUM: Magnesium: 1.8 mg/dL (ref 1.7–2.4)

## 2017-11-20 MED ORDER — POTASSIUM CHLORIDE CRYS ER 20 MEQ PO TBCR
40.0000 meq | EXTENDED_RELEASE_TABLET | Freq: Once | ORAL | Status: AC
  Administered 2017-11-20: 40 meq via ORAL
  Filled 2017-11-20: qty 2

## 2017-11-20 MED ORDER — LORAZEPAM 2 MG/ML IJ SOLN
1.0000 mg | INTRAMUSCULAR | Status: DC | PRN
  Administered 2017-11-20 – 2017-11-21 (×2): 1 mg via INTRAVENOUS
  Filled 2017-11-20 (×2): qty 1

## 2017-11-20 MED ORDER — SODIUM CHLORIDE 0.9 % IV SOLN
3.0000 g | Freq: Three times a day (TID) | INTRAVENOUS | Status: DC
  Administered 2017-11-20 – 2017-11-22 (×8): 3 g via INTRAVENOUS
  Filled 2017-11-20 (×9): qty 3

## 2017-11-20 MED ORDER — HALOPERIDOL LACTATE 5 MG/ML IJ SOLN
2.0000 mg | Freq: Four times a day (QID) | INTRAMUSCULAR | Status: DC | PRN
  Administered 2017-11-20 – 2017-11-22 (×3): 2 mg via INTRAVENOUS
  Filled 2017-11-20 (×3): qty 1

## 2017-11-20 NOTE — Progress Notes (Signed)
Pharmacy Antibiotic Note  Gabriel Orozco is a 72 y.o. male admitted on 11/16/2017 with pneumonia.  Pharmacy has been consulted for unasyn dosing.  Plan: Unasyn 3gm IV q8 hours F/u renal function, cultures and clinical course  Height: 5\' 10"  (177.8 cm) Weight: 199 lb 4.7 oz (90.4 kg) IBW/kg (Calculated) : 73  Temp (24hrs), Avg:97.8 F (36.6 C), Min:97.5 F (36.4 C), Max:98.2 F (36.8 C)  Recent Labs  Lab 11/16/17 2118 11/17/17 0215 11/18/17 0236 11/18/17 0834 11/19/17 0313 11/20/17 0349  WBC 15.1* 6.6 7.9 7.6 13.1* 16.3*  CREATININE 1.78* 1.44*  --   --  1.09 1.19    Estimated Creatinine Clearance: 64.4 mL/min (by C-G formula based on SCr of 1.19 mg/dL).    Allergies  Allergen Reactions  . Morphine And Related Hives and Itching    Thank you for allowing pharmacy to be a part of this patient's care.  Talbert Cage Poteet 11/20/2017 6:59 AM

## 2017-11-20 NOTE — Plan of Care (Signed)
  Problem: Clinical Measurements: Goal: Respiratory complications will improve Outcome: Progressing   Problem: Elimination: Goal: Will not experience complications related to urinary retention Outcome: Progressing   Problem: Health Behavior/Discharge Planning: Goal: Ability to manage health-related needs will improve Outcome: Not Progressing   Problem: Coping: Goal: Level of anxiety will decrease Outcome: Not Progressing   Problem: Elimination: Goal: Will not experience complications related to bowel motility Outcome: Not Progressing

## 2017-11-20 NOTE — Evaluation (Signed)
Clinical/Bedside Swallow Evaluation Patient Details  Name: Gabriel Orozco MRN: [id number] Date of Birth: 26-Apr-1945  Today's Date: 11/20/2017 Time: SLP Start Time (ACUTE ONLY): 1035 SLP Stop Time (ACUTE ONLY): 1055 SLP Time Calculation (min) (ACUTE ONLY): 20 min  Past Medical History:  Past Medical History:  Diagnosis Date  . Abdominal aneurysm (HCC)   . Bleeding ulcer 11/17/2017  . Dementia (HCC)   . Diabetes mellitus without complication (HCC)   . Hypertension   . Neuropathy   . Thyroid disease    Past Surgical History:  Past Surgical History:  Procedure Laterality Date  . BIOPSY  11/17/2017   Procedure: BIOPSY;  Surgeon: Benancio Deeds, MD;  Location: [facility] ENDOSCOPY;  Service: Gastroenterology;;  . ESOPHAGOGASTRODUODENOSCOPY N/A 11/17/2017   Procedure: ESOPHAGOGASTRODUODENOSCOPY (EGD);  Surgeon: Benancio Deeds, MD;  Location: [facility] ENDOSCOPY;  Service: Gastroenterology;  Laterality: N/A;  . HOT HEMOSTASIS N/A 11/17/2017   Procedure: HOT HEMOSTASIS (ARGON PLASMA COAGULATION/BICAP);  Surgeon: Benancio Deeds, MD;  Location: [facility] ENDOSCOPY;  Service: Gastroenterology;  Laterality: N/A;  . SHOULDER SURGERY    . TOTAL KNEE ARTHROPLASTY    . TRAUMATIC FINGER AMPUTATION     HPI:  Patient is a 72 y.o. male with PMH: dementia, DM2, HTN, HLD, diverticulosis, who presented to the hospital with GI bleed which becan at home. Per family and RN, he was having difficulty swallowing pills, with some refusal of pills and food as well. Per spouse, he has not been eating much in the past few days.   Assessment / Plan / Recommendation Clinical Impression  Patient presents with an oropharyngeal swallow that is WFL-WNL. He did not exhibit any overt s/s of penetration or aspiration with any of the consistencies tested (thin liquids, puree solids, regular solids). Clinician suspects that patient's difficulty with pills is related to his cognitive impairment from his dementia diagnosis. During  this evaluation, patient would become distracted if he heard his spouse or one of the other family members talking and would seem to forget that he had food in his mouth. Per spouse, patient has not been eating much the last few days and that he has been refusing some as well. Clinician spoke with spouse about minimizing distractions when he is eating and plan to request consult from dietician.  SLP Visit Diagnosis: Dysphagia, unspecified (R13.10)    Aspiration Risk  Mild aspiration risk    Diet Recommendation Thin liquid;Regular   Liquid Administration via: Cup;Straw Medication Administration: Whole meds with puree Supervision: Patient able to self feed;Full supervision/cueing for compensatory strategies Compensations: Minimize environmental distractions;Slow rate;Small sips/bites Postural Changes: Seated upright at 90 degrees    Other  Recommendations Recommended Consults: Other (Comment) Oral Care Recommendations: Oral care BID   Follow up Recommendations None N/A     Frequency and Duration  N/A          Prognosis  N/A      Swallow Study   General Date of Onset: 11/16/17 HPI: Patient is a 72 y.o. male with PMH: dementia, DM2, HTN, HLD, diverticulosis, who presented to the hospital with GI bleed which becan at home. Per family and RN, he was having difficulty swallowing pills, with some refusal of pills and food as well. Per spouse, he has not been eating much in the past few days. Type of Study: Bedside Swallow Evaluation Previous Swallow Assessment: N/A Diet Prior to this Study: Regular Temperature Spikes Noted: No Respiratory Status: Room air History of Recent Intubation: No Behavior/Cognition: Alert;Cooperative;Pleasant mood;Confused Oral Cavity Assessment:  Within Functional Limits Oral Care Completed by SLP: No Oral Cavity - Dentition: Dentures, bottom;Dentures, top Vision: Functional for self-feeding Self-Feeding Abilities: Able to feed self;Needs set up;Other  (Comment)(becomes distracted and forgets he has food in his mouth) Patient Positioning: Upright in bed Baseline Vocal Quality: Normal Volitional Cough: Cognitively unable to elicit Volitional Swallow: Unable to elicit    Oral/Motor/Sensory Function Overall Oral Motor/Sensory Function: Within functional limits   Ice Chips Ice chips: Not tested   Thin Liquid Thin Liquid: Within functional limits Presentation: Cup;Straw Other Comments: No overt s/s of aspiration or penetration.    Nectar Thick Nectar Thick Liquid: Not tested   Honey Thick Honey Thick Liquid: Not tested   Puree Puree: Within functional limits Presentation: Spoon   Solid     Solid: Within functional limits     Angela Nevin, MA, CCC-SLP 11/20/17 5:14 PM

## 2017-11-20 NOTE — Progress Notes (Signed)
Progress Note   Subjective  Patient continues to have some issues with urine retention. Also noted to have a pneumonia with rising WBC and started on antibiotics. He has not had any bowel movements, no dark stools. Hgb has downtrended today to 8   Objective   Vital signs in last 24 hours: Temp:  [97.5 F (36.4 C)-98.2 F (36.8 C)] 98.2 F (36.8 C) (11/02 0230) Pulse Rate:  [66-88] 74 (11/02 0915) Resp:  [16-84] 17 (11/02 0915) BP: (115-141)/(52-70) 125/58 (11/02 0915) SpO2:  [93 %-97 %] 93 % (11/02 0230) Weight:  [90.4 kg] 90.4 kg (11/02 0230) Last BM Date: 11/17/17 General:    white male in NAD Heart:  Regular rate and rhythm;  Lungs: Respirations even and unlabored,  Abdomen:  Soft, nontender  Extremities:  Without edema. Neurologic:  Alert and oriented x 1, dementia,  grossly normal neurologically. Psych:  Cooperative. Normal mood and affect.  Intake/Output from previous day: 11/01 0701 - 11/02 0700 In: 1993.3 [P.O.:840; I.V.:1153.3] Out: 1700 [Urine:1700] Intake/Output this shift: No intake/output data recorded.  Lab Results: Recent Labs    11/18/17 0834  11/19/17 0313 11/20/17 0349 11/20/17 0706  WBC 7.6  --  13.1* 16.3*  --   HGB 8.9*   < > 9.2* 8.0* 8.0*  HCT 27.1*   < > 26.4* 24.0* 24.4*  PLT 277  --  281 290  --    < > = values in this interval not displayed.   BMET Recent Labs    11/19/17 0313 11/20/17 0349  NA 137 136  K 3.0* 3.2*  CL 107 110  CO2 21* 23  GLUCOSE 115* 111*  BUN 8 9  CREATININE 1.09 1.19  CALCIUM 8.6* 8.1*   LFT No results for input(s): PROT, ALBUMIN, AST, ALT, ALKPHOS, BILITOT, BILIDIR, IBILI in the last 72 hours. PT/INR No results for input(s): LABPROT, INR in the last 72 hours.  Studies/Results: Dg Chest 2 View  Result Date: 11/19/2017 CLINICAL DATA:  72 y/o M; dark colored rectal bleeding. Cough since the [facility] 11/17/2017. EXAM: CHEST - 2 VIEW COMPARISON:  None. FINDINGS: Normal cardiac silhouette. Bronchitic  changes in the lung bases. No consolidation. No pleural effusion or pneumothorax. No acute osseous abnormality is evident. IMPRESSION: Bronchitic changes in the lung bases, possibly aspiration given distribution. No consolidation. Electronically Signed   By: Mitzi Hansen M.D.   On: 11/19/2017 17:23       Assessment / Plan:    72 y/o male who was admitted with an upper GI bleed. EGD 10/30 showed large duodenal ulcer with visible vessel treated with goldprobe cautery. High risk lesion for bleeding given the stigmata noted and the size of the lesion. He's been on IV PPI since that time and has not had any further bleeding symptoms. Hgb has downtrended today but BUN remains normal, could be equilibration, I don't think he's actively bleeding. Otherwise has developed a pneumonia and urinary retention issues while hospitalized, now on antibiotics.  I would continue IV PPI today and transition to 40mg  protonix BID tomorrow, he should take that for 8 weeks and then decrease to 40mg  once daily. He can follow up with his primary GI in [place] after discharge or with our office, whichever his preference. He should avoid all NSAIDs. Biopsies of his stomach are negative for H Pylori.   We will follow peripherally for now, please call with questions if any issues or symptoms concerning for rebleeding in the interim. Would try  to minimize blood draws otherwise.  Ileene Patrick, MD [facility] Gastroenterology

## 2017-11-20 NOTE — Progress Notes (Signed)
PROGRESS NOTE    Gabriel Orozco  WUJ:[id number] DOB: September 12, 1945 DOA: 11/16/2017 PCP: [facility]   Brief Narrative:  Patient is a 72 year old male with past medical history significant for dementia, diabetes mellitus type 2, hypertension, hyperlipidemia and diverticulosis.  Patient was admitted with dark-colored rectal bleed.    Gastroenterology consulted, status post EGD which showed non-bleeding diverticular ulcer.  Globin currently stable.  Assessment & Plan   GI bleed/acute on chronic anemia/acute blood loss anemia -Hemoglobin is 14.6 on 11/09/2017, currently 8 (drop from 9.2) -Patient has had dark melanotic stools -He has had colon polyps as well as diverticulosis on colonoscopy earlier this year -Has also been taking NSAIDs as well as aspirin at home -Gastroenterology consulted and appreciated -Status post EGD showing nonbleeding duodenal ulcer with visible vessel.  Treated with gold probe to ablate the vessel.  Also has duodenitis.  Biopsied for H. pylori rule out -Gastroenterology recommended continuing IV Protonix through tomorrow (11/2) and possibly transitioning to oral Protonix 40 mg twice daily tomorrow afternoon or evening.  Patient may resume a baby aspirin approximately 1 week. -given drop in hemoglobin, will discuss with GI  Possible aspiration/ cough -CXR obtained: Bronchitic changes in lung bases, possibly aspiration given distribution.  No consolidation -Speech therapy consulted -patient also developed leukocytosis, today WBC 16.3 -placed on unasyn -currently afebrile -will order sputum gram stain/culture  Upper abdominal pain -Possibly secondary to duodenal ulcer -CT scan done 1 week ago was suggestive of cholecystitis, minor umbilical and left inguinal hernias. -LFTs appear to be normal -Treat symptomatically  Urinary retention -Foley catheter was placed however causing patient irritation and family requesting to have it removed -Flomax  started -Foley catheter was removed, and patient has been able to urinate.  We will continue to monitor  Acute kidney injury -Resolved, creatinine on admission 1.78, currently down to 1.19 -Possibly secondary to urinary retention vs medications -Continue to monitor BMP  Diabetes mellitus, type II -Hemoglobin A1c 6.6 -metformin held -CBG's appear to be controlled  Essential hypertension -Amlodipine Cozaar held, currently BP remains stable  Hyperlipidemia -Continue statin  Dementia -Continue Namenda  Hypokalemia/hypomagnesemia -potassium 3.2, will continue to supplement -magnesium improved to 1.8 with suppplementation -continue to monitor   Deconditioning -PT recommended home health -OT evaluated patient, no further recommendations needed  DVT Prophylaxis  SCDs  Code Status: DNR  Family Communication: Wife at bedside  Disposition Plan: Admitted. Suspect discharge to home with home health in 1-2 days, pending improvement in hemoglobin and possible aspiration   Consultants Gastroenterology  Procedures  EGD  Antibiotics   Anti-infectives (From admission, onward)   Start     Dose/Rate Route Frequency Ordered Stop   11/20/17 0700  Ampicillin-Sulbactam (UNASYN) 3 g in sodium chloride 0.9 % 100 mL IVPB     3 g 200 mL/hr over 30 Minutes Intravenous Every 8 hours 11/20/17 9562        Subjective:   Gabriel Orozco seen and examined today.  Patient with dementia.  Unable to carry on meaningful conversation.  Does state that he coughs.  Has no other complaints at this time.   Objective:   Vitals:   11/19/17 2010 11/20/17 0005 11/20/17 0230 11/20/17 0915  BP: (!) 129/58 (!) 115/52 (!) 123/55 (!) 125/58  Pulse: 66 68 73 74  Resp: 16 17 (!) 21 17  Temp: 97.6 F (36.4 C) (!) 97.5 F (36.4 C) 98.2 F (36.8 C)   TempSrc: Oral Oral Oral   SpO2: 95% 96% 93%   Weight:  90.4 kg   Height:        Intake/Output Summary (Last 24 hours) at 11/20/2017 0959 Last data filed  at 11/20/2017 1610 Gross per 24 hour  Intake 1753.25 ml  Output 1700 ml  Net 53.25 ml   Filed Weights   11/20/17 0230  Weight: 90.4 kg   Exam  General: Well developed, well nourished, NAD, appears stated age  HEENT: NCAT, mucous membranes moist.   Neck: Supple  Cardiovascular: S1 S2 auscultated, no murmur, RRR  Respiratory: Diminished but clear, no wheezing   Abdomen: Soft, nontender, nondistended, + bowel sounds  Extremities: warm dry without cyanosis clubbing or edema  Neuro: AAOx1 (self only), dementia, nonfocal  Psych: Pleasant, appropriate mood and affect.    Data Reviewed: I have personally reviewed following labs and imaging studies  CBC: Recent Labs  Lab 11/17/17 0215 11/18/17 0236 11/18/17 0834 11/18/17 1458 11/19/17 0313 11/20/17 0349 11/20/17 0706  WBC 6.6 7.9 7.6  --  13.1* 16.3*  --   HGB 11.1* 8.7* 8.9* 8.6* 9.2* 8.0* 8.0*  HCT 33.7* 25.5* 27.1* 25.6* 26.4* 24.0* 24.4*  MCV 93.1 91.7 92.2  --  90.4 91.3  --   PLT 240 249 277  --  281 290  --    Basic Metabolic Panel: Recent Labs  Lab 11/16/17 2118 11/17/17 0215 11/19/17 0313 11/20/17 0349  NA 137 138 137 136  K 4.3 4.8 3.0* 3.2*  CL 107 109 107 110  CO2 19* 20* 21* 23  GLUCOSE 215* 144* 115* 111*  BUN 26* 31* 8 9  CREATININE 1.78* 1.44* 1.09 1.19  CALCIUM 8.7* 8.4* 8.6* 8.1*  MG  --  1.4* 1.1* 1.8  PHOS  --  1.9*  --   --    GFR: Estimated Creatinine Clearance: 64.4 mL/min (by C-G formula based on SCr of 1.19 mg/dL). Liver Function Tests: Recent Labs  Lab 11/16/17 2213 11/17/17 0215  AST 19 13*  ALT 17 14  ALKPHOS 57 41  BILITOT 0.8 0.5  PROT 6.0* 5.2*  ALBUMIN 3.2* 2.7*   No results for input(s): LIPASE, AMYLASE in the last 168 hours. No results for input(s): AMMONIA in the last 168 hours. Coagulation Profile: Recent Labs  Lab 11/16/17 2213  INR 1.26   Cardiac Enzymes: Recent Labs  Lab 11/17/17 0213 11/17/17 0759 11/17/17 1425  TROPONINI <0.03 <0.03 <0.03    BNP (last 3 results) No results for input(s): PROBNP in the last 8760 hours. HbA1C: No results for input(s): HGBA1C in the last 72 hours. CBG: Recent Labs  Lab 11/19/17 1115 11/19/17 1623 11/19/17 2009 11/20/17 0001 11/20/17 0228  GLUCAP 121* 158* 146* 132* 127*   Lipid Profile: No results for input(s): CHOL, HDL, LDLCALC, TRIG, CHOLHDL, LDLDIRECT in the last 72 hours. Thyroid Function Tests: No results for input(s): TSH, T4TOTAL, FREET4, T3FREE, THYROIDAB in the last 72 hours. Anemia Panel: No results for input(s): VITAMINB12, FOLATE, FERRITIN, TIBC, IRON, RETICCTPCT in the last 72 hours. Urine analysis:    Component Value Date/Time   COLORURINE YELLOW 11/17/2017 0510   APPEARANCEUR CLEAR 11/17/2017 0510   LABSPEC 1.019 11/17/2017 0510   PHURINE 5.0 11/17/2017 0510   GLUCOSEU 50 (A) 11/17/2017 0510   HGBUR NEGATIVE 11/17/2017 0510   BILIRUBINUR NEGATIVE 11/17/2017 0510   KETONESUR 5 (A) 11/17/2017 0510   PROTEINUR NEGATIVE 11/17/2017 0510   NITRITE NEGATIVE 11/17/2017 0510   LEUKOCYTESUR NEGATIVE 11/17/2017 0510   Sepsis Labs: @LABRCNTIP (procalcitonin:4,lacticidven:4)  )No results found for this or any previous visit (  from the past 240 hour(s)).    Radiology Studies: Dg Chest 2 View  Result Date: 11/19/2017 CLINICAL DATA:  72 y/o M; dark colored rectal bleeding. Cough since the [facility] 11/17/2017. EXAM: CHEST - 2 VIEW COMPARISON:  None. FINDINGS: Normal cardiac silhouette. Bronchitic changes in the lung bases. No consolidation. No pleural effusion or pneumothorax. No acute osseous abnormality is evident. IMPRESSION: Bronchitic changes in the lung bases, possibly aspiration given distribution. No consolidation. Electronically Signed   By: Mitzi Hansen M.D.   On: 11/19/2017 17:23     Scheduled Meds: . insulin aspart  0-9 Units Subcutaneous Q4H  . memantine  5 mg Oral BID  . [START ON 11/21/2017] pantoprazole  40 mg Oral BID  . QUEtiapine  100 mg Oral QHS   . simvastatin  20 mg Oral QHS  . tamsulosin  0.4 mg Oral Daily   Continuous Infusions: . sodium chloride 125 mL/hr at 11/20/17 0840  . ampicillin-sulbactam (UNASYN) IV 3 g (11/20/17 0839)     LOS: 4 days   Time Spent in minutes   30 minutes  Riah Kehoe D.O. on 11/20/2017 at 9:59 AM  Between 7am to 7pm - Please see pager noted on [email or web address]  After 7pm go to [email or web address]  And look for the night coverage person covering for me after hours  [facility] Office  [phone]

## 2017-11-21 LAB — CBC
HCT: 23.2 % — ABNORMAL LOW (ref 39.0–52.0)
Hemoglobin: 7.5 g/dL — ABNORMAL LOW (ref 13.0–17.0)
MCH: 30.2 pg (ref 26.0–34.0)
MCHC: 32.3 g/dL (ref 30.0–36.0)
MCV: 93.5 fL (ref 80.0–100.0)
PLATELETS: 299 10*3/uL (ref 150–400)
RBC: 2.48 MIL/uL — AB (ref 4.22–5.81)
RDW: 12.9 % (ref 11.5–15.5)
WBC: 15 10*3/uL — AB (ref 4.0–10.5)
nRBC: 0 % (ref 0.0–0.2)

## 2017-11-21 LAB — BASIC METABOLIC PANEL
Anion gap: 7 (ref 5–15)
BUN: 6 mg/dL — ABNORMAL LOW (ref 8–23)
CO2: 21 mmol/L — ABNORMAL LOW (ref 22–32)
Calcium: 8.1 mg/dL — ABNORMAL LOW (ref 8.9–10.3)
Chloride: 110 mmol/L (ref 98–111)
Creatinine, Ser: 1.07 mg/dL (ref 0.61–1.24)
GFR calc Af Amer: >60 mL/min (ref >60)
GFR calc non Af Amer: >60 mL/min (ref >60)
Glucose, Bld: 91 mg/dL (ref 70–99)
Potassium: 3.1 mmol/L — ABNORMAL LOW (ref 3.5–5.1)
Sodium: 138 mmol/L (ref 135–145)

## 2017-11-21 LAB — PREPARE RBC (CROSSMATCH)

## 2017-11-21 LAB — GLUCOSE, CAPILLARY
Glucose-Capillary: 99 mg/dL (ref 70–99)
Glucose-Capillary: 141 mg/dL — ABNORMAL HIGH (ref 70–99)
Glucose-Capillary: 135 mg/dL — ABNORMAL HIGH (ref 70–99)
Glucose-Capillary: 122 mg/dL — ABNORMAL HIGH (ref 70–99)

## 2017-11-21 LAB — MAGNESIUM: Magnesium: 1.5 mg/dL — ABNORMAL LOW (ref 1.7–2.4)

## 2017-11-21 MED ORDER — SODIUM CHLORIDE 0.9% IV SOLUTION
Freq: Once | INTRAVENOUS | Status: AC
  Administered 2017-11-21: 17:00:00 via INTRAVENOUS

## 2017-11-21 MED ORDER — MAGNESIUM SULFATE 4 GM/100ML IV SOLN
4.0000 g | Freq: Once | INTRAVENOUS | Status: AC
  Administered 2017-11-21: 4 g via INTRAVENOUS
  Filled 2017-11-21: qty 100

## 2017-11-21 MED ORDER — PANTOPRAZOLE SODIUM 40 MG IV SOLR
40.0000 mg | Freq: Two times a day (BID) | INTRAVENOUS | Status: DC
  Administered 2017-11-21 (×2): 40 mg via INTRAVENOUS
  Filled 2017-11-21 (×2): qty 40

## 2017-11-21 MED ORDER — POTASSIUM CHLORIDE CRYS ER 20 MEQ PO TBCR
40.0000 meq | EXTENDED_RELEASE_TABLET | ORAL | Status: AC
  Administered 2017-11-21 (×2): 40 meq via ORAL
  Filled 2017-11-21 (×2): qty 2

## 2017-11-21 MED ORDER — HALOPERIDOL LACTATE 5 MG/ML IJ SOLN
2.0000 mg | Freq: Once | INTRAMUSCULAR | Status: AC
  Administered 2017-11-21: 2 mg via INTRAMUSCULAR
  Filled 2017-11-21: qty 1

## 2017-11-21 MED ORDER — POTASSIUM CHLORIDE 20 MEQ/15ML (10%) PO SOLN
40.0000 meq | Freq: Every day | ORAL | Status: DC
  Administered 2017-11-22: 40 meq via ORAL
  Filled 2017-11-21: qty 30

## 2017-11-21 MED ORDER — PANTOPRAZOLE SODIUM 40 MG PO TBEC
40.0000 mg | DELAYED_RELEASE_TABLET | Freq: Two times a day (BID) | ORAL | Status: DC
  Administered 2017-11-22: 40 mg via ORAL
  Filled 2017-11-21: qty 1

## 2017-11-21 NOTE — Progress Notes (Addendum)
PROGRESS NOTE    Gabriel Orozco  ZOX:[id number] DOB: Apr 12, 1945 DOA: 11/16/2017 PCP: [facility]   Brief Narrative:  Patient is a 72 year old male with past medical history significant for dementia, diabetes mellitus type 2, hypertension, hyperlipidemia and diverticulosis.  Patient was admitted with dark-colored rectal bleed.    Gastroenterology consulted, status post EGD which showed non-bleeding diverticular ulcer.    Assessment & Plan   GI bleed/acute on chronic anemia/acute blood loss anemia -Hemoglobin is 14.6 on 11/09/2017, currently 7.5 (drop from 9.2) -Patient has had dark melanotic stools -He has had colon polyps as well as diverticulosis on colonoscopy earlier this year -Has also been taking NSAIDs as well as aspirin at home -Gastroenterology consulted and appreciated -Status post EGD showing nonbleeding duodenal ulcer with visible vessel.  Treated with gold probe to ablate the vessel.  Also has duodenitis.  Biopsied for H. Pylori negative -Gastroenterology recommended continuing protonix 40mg  BID PO for 8 weeks and then decrease to 40mg  daily. Follow up with GI in [place] or [place]. Avoid all NSAIDs -?no further dark stools or evidence of bleeding -Continue to monitor CBC  Possible aspiration/ cough -CXR obtained: Bronchitic changes in lung bases, possibly aspiration given distribution.  No consolidation -Speech therapy consulted- recommended regular, thin liquids -patient also developed leukocytosis, today WBC down to 15 -placed on unasyn -currently afebrile -will order sputum gram stain/culture  Upper abdominal pain -Possibly secondary to duodenal ulcer -CT scan done 1 week ago was suggestive of cholecystitis, minor umbilical and left inguinal hernias. -LFTs appear to be normal -Treat symptomatically  Urinary retention -Foley catheter was placed however causing patient irritation and family requesting to have it removed -Flomax started -Foley  catheter was removed, and patient has been able to urinate.  We will continue to monitor  Acute kidney injury -Resolved, creatinine on admission 1.78, currently down to 1.07 -Possibly secondary to urinary retention vs medications -Continue to monitor BMP  Diabetes mellitus, type II -Hemoglobin A1c 6.6 -metformin held -CBG's appear to be controlled  Essential hypertension -Amlodipine Cozaar held, currently BP remains stable  Hyperlipidemia -Continue statin   Dementia with agitation  -Continue Namenda -placed on ativan and haldol PRN (patient threatend his family members)  Hypokalemia/hypomagnesemia -potassium 3.1, despite supplemenation- will give both pill and liquid , -magnesium down to 1.5, will supplement -continue to monitor   Deconditioning -PT recommended home health -OT evaluated patient, no further recommendations needed  DVT Prophylaxis  SCDs  Code Status: DNR  Family Communication: none at bedside  Disposition Plan: Admitted. Suspect discharge to home with home health in 1-2 days, pending improvement in hemoglobin and possible aspiration   Consultants Gastroenterology  Procedures  EGD  Antibiotics   Anti-infectives (From admission, onward)   Start     Dose/Rate Route Frequency Ordered Stop   11/20/17 0700  Ampicillin-Sulbactam (UNASYN) 3 g in sodium chloride 0.9 % 100 mL IVPB     3 g 200 mL/hr over 30 Minutes Intravenous Every 8 hours 11/20/17 8119        Subjective:   Gabriel Orozco seen and examined today.  Patient with dementia.  Denies cough or shortness of breath, chest pain, abdominal pain. Has no complaints at this time.  Objective:   Vitals:   11/20/17 2155 11/20/17 2210 11/21/17 0008 11/21/17 0250  BP: (!) 118/55  (!) 107/52 123/64  Pulse: (!) 59 60  66  Resp:      Temp: 99.8 F (37.7 C)     TempSrc: Oral  SpO2: 98% 98%  95%  Weight:    91 kg  Height:        Intake/Output Summary (Last 24 hours) at 11/21/2017 1002 Last data  filed at 11/21/2017 0200 Gross per 24 hour  Intake 3335.16 ml  Output 600 ml  Net 2735.16 ml   Filed Weights   11/20/17 0230 11/21/17 0250  Weight: 90.4 kg 91 kg   Exam  General: Well developed, well nourished, NAD, appears stated age  HEENT: NCAT, mucous membranes moist.   Neck: Supple  Cardiovascular: S1 S2 auscultated, RRR, no murmur  Respiratory: Diminished but clear, no wheezing   Abdomen: Soft, nontender, nondistended, + bowel sounds  Extremities: warm dry without cyanosis clubbing or edema  Neuro: AAOx1 (self only), dementia. nonfocal  Psych: Pleasant, appropriate mood and affect  Data Reviewed: I have personally reviewed following labs and imaging studies  CBC: Recent Labs  Lab 11/18/17 0236 11/18/17 0834 11/18/17 1458 11/19/17 0313 11/20/17 0349 11/20/17 0706 11/21/17 0245  WBC 7.9 7.6  --  13.1* 16.3*  --  15.0*  HGB 8.7* 8.9* 8.6* 9.2* 8.0* 8.0* 7.5*  HCT 25.5* 27.1* 25.6* 26.4* 24.0* 24.4* 23.2*  MCV 91.7 92.2  --  90.4 91.3  --  93.5  PLT 249 277  --  281 290  --  299   Basic Metabolic Panel: Recent Labs  Lab 11/16/17 2118 11/17/17 0215 11/19/17 0313 11/20/17 0349 11/21/17 0245  NA 137 138 137 136 138  K 4.3 4.8 3.0* 3.2* 3.1*  CL 107 109 107 110 110  CO2 19* 20* 21* 23 21*  GLUCOSE 215* 144* 115* 111* 91  BUN 26* 31* 8 9 6*  CREATININE 1.78* 1.44* 1.09 1.19 1.07  CALCIUM 8.7* 8.4* 8.6* 8.1* 8.1*  MG  --  1.4* 1.1* 1.8 1.5*  PHOS  --  1.9*  --   --   --    GFR: Estimated Creatinine Clearance: 71.8 mL/min (by C-G formula based on SCr of 1.07 mg/dL). Liver Function Tests: Recent Labs  Lab 11/16/17 2213 11/17/17 0215  AST 19 13*  ALT 17 14  ALKPHOS 57 41  BILITOT 0.8 0.5  PROT 6.0* 5.2*  ALBUMIN 3.2* 2.7*   No results for input(s): LIPASE, AMYLASE in the last 168 hours. No results for input(s): AMMONIA in the last 168 hours. Coagulation Profile: Recent Labs  Lab 11/16/17 2213  INR 1.26   Cardiac Enzymes: Recent Labs    Lab 11/17/17 0213 11/17/17 0759 11/17/17 1425  TROPONINI <0.03 <0.03 <0.03   BNP (last 3 results) No results for input(s): PROBNP in the last 8760 hours. HbA1C: No results for input(s): HGBA1C in the last 72 hours. CBG: Recent Labs  Lab 11/20/17 0228 11/20/17 1159 11/20/17 1643 11/20/17 2152 11/21/17 0604  GLUCAP 127* 160* 161* 98 99   Lipid Profile: No results for input(s): CHOL, HDL, LDLCALC, TRIG, CHOLHDL, LDLDIRECT in the last 72 hours. Thyroid Function Tests: No results for input(s): TSH, T4TOTAL, FREET4, T3FREE, THYROIDAB in the last 72 hours. Anemia Panel: No results for input(s): VITAMINB12, FOLATE, FERRITIN, TIBC, IRON, RETICCTPCT in the last 72 hours. Urine analysis:    Component Value Date/Time   COLORURINE YELLOW 11/17/2017 0510   APPEARANCEUR CLEAR 11/17/2017 0510   LABSPEC 1.019 11/17/2017 0510   PHURINE 5.0 11/17/2017 0510   GLUCOSEU 50 (A) 11/17/2017 0510   HGBUR NEGATIVE 11/17/2017 0510   BILIRUBINUR NEGATIVE 11/17/2017 0510   KETONESUR 5 (A) 11/17/2017 0510   PROTEINUR NEGATIVE 11/17/2017 0510  NITRITE NEGATIVE 11/17/2017 0510   LEUKOCYTESUR NEGATIVE 11/17/2017 0510   Sepsis Labs: @LABRCNTIP (procalcitonin:4,lacticidven:4)  )No results found for this or any previous visit (from the past 240 hour(s)).    Radiology Studies: Dg Chest 2 View  Result Date: 11/19/2017 CLINICAL DATA:  72 y/o M; dark colored rectal bleeding. Cough since the [facility] 11/17/2017. EXAM: CHEST - 2 VIEW COMPARISON:  None. FINDINGS: Normal cardiac silhouette. Bronchitic changes in the lung bases. No consolidation. No pleural effusion or pneumothorax. No acute osseous abnormality is evident. IMPRESSION: Bronchitic changes in the lung bases, possibly aspiration given distribution. No consolidation. Electronically Signed   By: Mitzi Hansen M.D.   On: 11/19/2017 17:23     Scheduled Meds: . memantine  5 mg Oral BID  . pantoprazole  40 mg Oral BID  . potassium chloride   40 mEq Oral Q4H  . QUEtiapine  100 mg Oral QHS  . simvastatin  20 mg Oral QHS  . tamsulosin  0.4 mg Oral Daily   Continuous Infusions: . sodium chloride 125 mL/hr at 11/21/17 0136  . ampicillin-sulbactam (UNASYN) IV 3 g (11/21/17 0557)  . magnesium sulfate 1 - 4 g bolus IVPB 4 g (11/21/17 0813)     LOS: 5 days   Time Spent in minutes   30 minutes  Kamyiah Colantonio D.O. on 11/21/2017 at 10:02 AM  Between 7am to 7pm - Please see pager noted on [email or web address]  After 7pm go to [email or web address]  And look for the night coverage person covering for me after hours  [facility] Office  [phone]

## 2017-11-22 DIAGNOSIS — E785 Hyperlipidemia, unspecified: Secondary | ICD-10-CM

## 2017-11-22 DIAGNOSIS — E1169 Type 2 diabetes mellitus with other specified complication: Secondary | ICD-10-CM

## 2017-11-22 LAB — BPAM RBC
BLOOD PRODUCT EXPIRATION DATE: 201912012359
ISSUE DATE / TIME: 201911031655
UNIT TYPE AND RH: 5100

## 2017-11-22 LAB — BASIC METABOLIC PANEL
ANION GAP: 4 — AB (ref 5–15)
BUN: 8 mg/dL (ref 8–23)
CO2: 22 mmol/L (ref 22–32)
Calcium: 8.2 mg/dL — ABNORMAL LOW (ref 8.9–10.3)
Chloride: 109 mmol/L (ref 98–111)
Creatinine, Ser: 1.02 mg/dL (ref 0.61–1.24)
GFR calc non Af Amer: >60 mL/min (ref >60)
Glucose, Bld: 112 mg/dL — ABNORMAL HIGH (ref 70–99)
POTASSIUM: 3.4 mmol/L — AB (ref 3.5–5.1)
Sodium: 135 mmol/L (ref 135–145)

## 2017-11-22 LAB — TYPE AND SCREEN
ABO/RH(D): O POS
Antibody Screen: NEGATIVE
Unit division: 0

## 2017-11-22 LAB — CBC
HCT: 26.7 % — ABNORMAL LOW (ref 39.0–52.0)
Hemoglobin: 9.2 g/dL — ABNORMAL LOW (ref 13.0–17.0)
MCH: 30.8 pg (ref 26.0–34.0)
MCHC: 34.5 g/dL (ref 30.0–36.0)
MCV: 89.3 fL (ref 80.0–100.0)
NRBC: 0 % (ref 0.0–0.2)
PLATELETS: 284 10*3/uL (ref 150–400)
RBC: 2.99 MIL/uL — AB (ref 4.22–5.81)
RDW: 12.8 % (ref 11.5–15.5)
WBC: 8.2 10*3/uL (ref 4.0–10.5)

## 2017-11-22 LAB — MAGNESIUM: Magnesium: 1.7 mg/dL (ref 1.7–2.4)

## 2017-11-22 LAB — GLUCOSE, CAPILLARY
GLUCOSE-CAPILLARY: 104 mg/dL — AB (ref 70–99)
GLUCOSE-CAPILLARY: 132 mg/dL — AB (ref 70–99)

## 2017-11-22 MED ORDER — MAGNESIUM SULFATE 2 GM/50ML IV SOLN
2.0000 g | Freq: Once | INTRAVENOUS | Status: AC
  Administered 2017-11-22: 2 g via INTRAVENOUS
  Filled 2017-11-22: qty 50

## 2017-11-22 MED ORDER — TAMSULOSIN HCL 0.4 MG PO CAPS: 0.4000 mg | ORAL_CAPSULE | Freq: Every day | ORAL | 0 refills | Status: AC

## 2017-11-22 MED ORDER — PANTOPRAZOLE SODIUM 40 MG PO TBEC: 40.0000 mg | DELAYED_RELEASE_TABLET | Freq: Two times a day (BID) | ORAL | 0 refills | Status: AC

## 2017-11-22 MED ORDER — POTASSIUM CHLORIDE CRYS ER 20 MEQ PO TBCR
60.0000 meq | EXTENDED_RELEASE_TABLET | Freq: Once | ORAL | Status: AC
  Administered 2017-11-22: 60 meq via ORAL
  Filled 2017-11-22: qty 3

## 2017-11-22 MED ORDER — AMOXICILLIN-POT CLAVULANATE 875-125 MG PO TABS: 1.0000 | ORAL_TABLET | Freq: Two times a day (BID) | ORAL | 0 refills | Status: AC

## 2017-11-22 NOTE — Plan of Care (Signed)
  Problem: Clinical Measurements: Goal: Respiratory complications will improve Outcome: Progressing Goal: Cardiovascular complication will be avoided Outcome: Progressing   Problem: Activity: Goal: Risk for activity intolerance will decrease Outcome: Progressing   Problem: Elimination: Goal: Will not experience complications related to urinary retention Outcome: Progressing   Problem: Education: Goal: Knowledge of General Education information will improve Description Including pain rating scale, medication(s)/side effects and non-pharmacologic comfort measures Outcome: Not Progressing   Problem: Health Behavior/Discharge Planning: Goal: Ability to manage health-related needs will improve Outcome: Not Progressing   Problem: Elimination: Goal: Will not experience complications related to bowel motility Outcome: Not Progressing   Problem: Safety: Goal: Ability to remain free from injury will improve Outcome: Not Progressing

## 2017-11-22 NOTE — Progress Notes (Signed)
Physical Therapy Treatment Patient Details Name: Gabriel Orozco MRN: [id number] DOB: 1945-03-23 Today's Date: 11/22/2017    History of Present Illness Pt is a 72 y/o male admitted secondary to abdominal pain and bloody stools. Thought to be secondary to GI bleed. Pt likely for EGD on 10/30 in afternoon per notes. PMH includes dementia, ETOH abuse, DM, HTN, and AAA.     PT Comments    Patient progressing well with mobility, remains min guard to supervision levels without need for device. Pleasantly confused during session. Current POC remains appropriate.   Follow Up Recommendations  Home health PT;Supervision/Assistance - 24 hour     Equipment Recommendations  None recommended by PT    Recommendations for Other Services OT consult     Precautions / Restrictions Precautions Precautions: Fall Restrictions Weight Bearing Restrictions: No    Mobility  Bed Mobility Overal bed mobility: Needs Assistance Bed Mobility: Sit to Supine     Supine to sit: Supervision;HOB elevated Sit to supine: Supervision   General bed mobility comments: Vcs for positioning, no physical assist required  Transfers Overall transfer level: Needs assistance Equipment used: None Transfers: Sit to/from Stand Sit to Stand: Min guard         General transfer comment: Min guard for safety and stability  Ambulation/Gait Ambulation/Gait assistance: Supervision Gait Distance (Feet): 310 Feet Assistive device: None Gait Pattern/deviations: Step-through pattern;Decreased stride length Gait velocity: Decreased  Gait velocity interpretation: <1.8 ft/sec, indicate of risk for recurrent falls General Gait Details: some instability with ambulation, able to self correct   Stairs             Wheelchair Mobility    Modified Rankin (Stroke Patients Only)       Balance Overall balance assessment: Needs assistance Sitting-balance support: No upper extremity supported;Feet supported Sitting  balance-Leahy Scale: Good     Standing balance support: No upper extremity supported;During functional activity Standing balance-Leahy Scale: Fair Standing balance comment: standing at sink for grooming tasks                            Cognition Arousal/Alertness: Awake/alert Behavior During Therapy: WFL for tasks assessed/performed Overall Cognitive Status: History of cognitive impairments - at baseline                                 General Comments: Dementia at baseline. Gave him his socks and shoes to put on. He put one sock on and then while holding one of the shoes he looked at other shoe and said, "where does that go"      Exercises      General Comments        Pertinent Vitals/Pain Pain Assessment: No/denies pain    Home Living                      Prior Function            PT Goals (current goals can now be found in the care plan section) Acute Rehab PT Goals Patient Stated Goal: for pt to return home per wife PT Goal Formulation: With patient/family Time For Goal Achievement: 12/01/17 Potential to Achieve Goals: Good Progress towards PT goals: Progressing toward goals    Frequency    Min 3X/week      PT Plan Current plan remains appropriate    Co-evaluation  AM-PAC PT "6 Clicks" Daily Activity  Outcome Measure  Difficulty turning over in bed (including adjusting bedclothes, sheets and blankets)?: A Little Difficulty moving from lying on back to sitting on the side of the bed? : Unable Difficulty sitting down on and standing up from a chair with arms (e.g., wheelchair, bedside commode, etc,.)?: Unable Help needed moving to and from a bed to chair (including a wheelchair)?: A Little Help needed walking in hospital room?: A Little Help needed climbing 3-5 steps with a railing? : A Lot 6 Click Score: 13    End of Session Equipment Utilized During Treatment: Gait belt Activity Tolerance:  Patient tolerated treatment well Patient left: in bed;with call bell/phone within reach;with bed alarm set Nurse Communication: Mobility status PT Visit Diagnosis: Unsteadiness on feet (R26.81);Muscle weakness (generalized) (M62.81)     Time: 1610-9604 PT Time Calculation (min) (ACUTE ONLY): 11 min  Charges:  $Gait Training: 8-22 mins                     Charlotte Crumb, PT DPT  Board Certified Neurologic Specialist Acute Rehabilitation Services Pager [phone] Office [phone]    Fabio Asa 11/22/2017, 1:41 PM

## 2017-11-22 NOTE — Progress Notes (Signed)
11/22/2017 1325 Discharge AVS meds taken today and those due this evening reviewed.  Follow-up appointments and when to call md reviewed.  D/C IV and TELE.  Questions and concerns addressed.   D/C home per orders. Kathryne Hitch

## 2017-11-22 NOTE — Progress Notes (Signed)
Occupational Therapy Treatment and Discharge Patient Details Name: Gabriel Orozco MRN: [id number] DOB: 04/22/1945 Today's Date: 11/22/2017    History of present illness Pt is a 72 y/o male admitted secondary to abdominal pain and bloody stools. Thought to be secondary to GI bleed. Pt likely for EGD on 10/30 in afternoon per notes. PMH includes dementia, ETOH abuse, DM, HTN, and AAA.    OT comments  This 72 yo male admitted with above seen today to focus on self care tasks of LBD and grooming. Pt put on his own socks and shoes today with min A- setup/S and stood at sink for grooming. Pt to D/C home today so we will D/C pt from acute OT services with 24 hour S/prn A recommended.  Follow Up Recommendations  No OT follow up;Supervision/Assistance - 24 hour    Equipment Recommendations  None recommended by OT       Precautions / Restrictions Precautions Precautions: Fall Restrictions Weight Bearing Restrictions: No       Mobility Bed Mobility Overal bed mobility: Needs Assistance Bed Mobility: Supine to Sit     Supine to sit: Supervision;HOB elevated        Transfers Overall transfer level: Needs assistance Equipment used: None Transfers: Sit to/from Stand Sit to Stand: Min guard              Balance Overall balance assessment: Needs assistance Sitting-balance support: No upper extremity supported;Feet supported Sitting balance-Leahy Scale: Good     Standing balance support: No upper extremity supported;During functional activity Standing balance-Leahy Scale: Fair Standing balance comment: standing at sink for grooming tasks                           ADL either performed or assessed with clinical judgement   ADL Overall ADL's : Needs assistance/impaired     Grooming: Min guard;Oral care;Brushing hair;Standing Grooming Details (indicate cue type and reason): Vcs and gestural cues throughtout for task and keeping on task             Lower Body  Dressing: Minimal assistance Lower Body Dressing Details (indicate cue type and reason): min guard A sit<>stand (could don right sock and shoe, but only left shoe--needed A with sock) as well as re-direction as to what the task was. Toilet Transfer: Min guard;Ambulation                   Vision Patient Visual Report: Central vision impairment            Cognition Arousal/Alertness: Awake/alert Behavior During Therapy: WFL for tasks assessed/performed Overall Cognitive Status: History of cognitive impairments - at baseline                                 General Comments: Dementia at baseline. Gave him his socks and shoes to put on. He put one sock on and then while holding one of the shoes he looked at other shoe and said, "where does that go"                   Pertinent Vitals/ Pain       Pain Assessment: No/denies pain     Prior Functioning/Environment              Frequency  Min 2X/week        Progress Toward Goals  OT Goals(current goals can now be  found in the care plan section)  Progress towards OT goals: Progressing toward goals     Plan Discharge plan needs to be updated       AM-PAC PT "6 Clicks" Daily Activity     Outcome Measure   Help from another person eating meals?: A Little Help from another person taking care of personal grooming?: A Little Help from another person toileting, which includes using toliet, bedpan, or urinal?: A Little Help from another person bathing (including washing, rinsing, drying)?: A Little Help from another person to put on and taking off regular upper body clothing?: A Little Help from another person to put on and taking off regular lower body clothing?: A Little 6 Click Score: 18    End of Session    OT Visit Diagnosis: Unsteadiness on feet (R26.81);Other abnormalities of gait and mobility (R26.89);Muscle weakness (generalized) (M62.81);Cognitive communication deficit (R41.841)   Activity  Tolerance Patient tolerated treatment well   Patient Left (walking with PT)           Time: 6644-0347 OT Time Calculation (min): 14 min  Charges: OT General Charges $OT Visit: 1 Visit OT Treatments $Self Care/Home Management : 8-22 mins  Ignacia Palma, OTR/L Acute [employer] Pager [phone] Office [phone]      Evette Georges 11/22/2017, 12:46 PM

## 2017-11-22 NOTE — Care Management Note (Signed)
Case Management Note Donn Pierini RN, BSN Transitions of Care Unit 4E- RN Case Manager [phone]  Patient Details  Name: Gabriel Orozco MRN: [id number] Date of Birth: [birth year]  Subjective/Objective:     Pt admitted with LGIB               Action/Plan: PTA pt lived at home with wife, hx of dementia. Followed by the [facility] clinic- per PT eval recommendation for [facility] services no DME. Orders have been placed for HHPT/OT/SW- call made to wife and spoke with her via TC along with sister Alison Stalling. Wife has concerns about caring for pt at home- however this has been an ongoing issue due to his dementia. Explained that hospital does not assist with Long Term placement- but that SW that will f/u with [facility] can assist her with that if that is what she is wanting to do. Wife states [facility] [employer] and pt has been approved for 15 hr/day.  Sister and wife both have concerns about wife being able to continue to care for pt at home- per sister son does not agree with placement- encouraged them to look into placement options as best care plan for future. Per wife, son will provide transport home. Choice offered for [facility] agency- per sister, they will use [facility]- referral called to Tiffany with [facility], referral accepted- noted on referral to have SW to f/u on possible long term placement needs and further resources for wife.   Expected Discharge Date:  11/22/17               Expected Discharge Plan:  Home w Home Health Services  In-House Referral:     Discharge planning Services  CM Consult  Post Acute Care Choice:  Home Health Choice offered to:  Spouse  DME Arranged:    DME Agency:     HH Arranged:  PT, OT, Social Work [employer] Agency:  [facility]  Status of Service:  Completed, signed off  If discussed at [employer] [facility] [employer], dates discussed:    Discharge Disposition: home/home health   Additional Comments:  Darrold Span, RN 11/22/2017, 12:44 PM

## 2017-11-22 NOTE — Discharge Instructions (Signed)
Gastrointestinal Bleeding °Gastrointestinal bleeding is bleeding somewhere along the path food travels through the body (digestive tract). This path is anywhere between the mouth and the opening of the butt (anus). You may have blood in your poop (stools) or have black poop. If you throw up (vomit), there may be blood in it. °This condition can be mild, serious, or even life-threatening. If you have a lot of bleeding, you may need to stay in the hospital. °Follow these instructions at home: °· Take over-the-counter and prescription medicines only as told by your doctor. °· Eat foods that have a lot of fiber in them. These foods include whole grains, fruits, and vegetables. You can also try eating 1-3 prunes each day. °· Drink enough fluid to keep your pee (urine) clear or pale yellow. °· Keep all follow-up visits as told by your doctor. This is important. °Contact a doctor if: °· Your symptoms do not get better. °Get help right away if: °· Your bleeding gets worse. °· You feel dizzy or you pass out (faint). °· You feel weak. °· You have very bad cramps in your back or belly (abdomen). °· You pass large clumps of blood (clots) in your poop. °· Your symptoms are getting worse. °This information is not intended to replace advice given to you by your health care provider. Make sure you discuss any questions you have with your health care provider. °Document Released: 10/15/2007 Document Revised: 06/13/2015 Document Reviewed: 06/25/2014 °Elsevier Interactive Patient Education © 2018 Elsevier Inc. ° °

## 2017-11-22 NOTE — Discharge Summary (Signed)
Physician Discharge Summary  Hiroki Wint WUJ:[id number] DOB: May 10, 1945 DOA: 11/16/2017  PCP: [facility]  Admit date: 11/16/2017 Discharge date: 11/22/2017  Time spent: 45 minutes  Recommendations for Outpatient Follow-up:  Patient will be discharged to home with home health physical and occupational therapy.  Patient will need to follow up with primary care provider within one week of discharge, repeat CBC, BMP, and magnesium.  Follow up with gastroenterology.  Patient should continue medications as prescribed.  Patient should follow a carb modified diet.   Discharge Diagnoses:  GI bleed/acute on chronic anemia/acute blood loss anemia Possible aspiration/ cough Upper abdominal pain Urinary retention Acute kidney injury Diabetes mellitus, type II Essential hypertension Hyperlipidemia Dementia with agitation  Hypokalemia/hypomagnesemia Deconditioning  Discharge Condition: Stable  Diet recommendation: carb modified  Filed Weights   11/20/17 0230 11/21/17 0250 11/22/17 0310  Weight: 90.4 kg 91 kg 89.5 kg    History of present illness:  On 11/16/2017 by Dr. Therisa Doyne Dijon Cosens is a 72 y.o. male with medical history significant of dementia, DM2, HTN, HLD, Diverticulosis  Presented with  Hx og Gi bleed at home laege vol, bright red stool, no sig bleed now hx of diverticulosis Severe bleeding today he have had about 4 total Bloody BM's This PM he was passing blood clots no BM since 7:30 PM Wife states he has had episodes of red blood per rectum since last year.  Last week wife noted some blood on the sheets.  Was seen at [facility] for abd pain last week CT showed diverticulosis Not on anticoagulation. He is on Aspirin 81 mg, up until yesterday has been taking diclofenac No recent steroid use No hematemesis No EtOh abuse  For the past 1 week he has been clutching his epigastric region un able to provide further information  Wife states he stopped  eating and drinking for the last 2-3 days  Hospital Course:  GI bleed/acute on chronic anemia/acute blood loss anemia -Hemoglobin is 14.6 on 11/09/2017 -Patient has had dark melanotic stools -He has had colon polyps as well as diverticulosis on colonoscopy earlier this year -Has also been taking NSAIDs as well as aspirin at home -Gastroenterology consulted and appreciated -Status post EGD showing nonbleeding duodenal ulcer with visible vessel.  Treated with gold probe to ablate the vessel.  Also has duodenitis.  Biopsied for H. Pylori negative -Gastroenterology recommended continuing protonix 40mg  BID PO for 8 weeks and then decrease to 40mg  daily. Follow up with GI in [place] or [place]. Avoid all NSAIDs -?no further dark stools or evidence of bleeding -Patient transfused 1 unit PRBC on 11/21/2017, hemoglobin currently 9.2 -Repeat CBC in one week  Possible aspiration/ cough -CXR obtained: Bronchitic changes in lung bases, possibly aspiration given distribution.  No consolidation -Speech therapy consulted- recommended regular, thin liquids -patient also developed leukocytosis, today WBC down to 8.2 -placed on unasyn -currently afebrile -will discharge patient with Augmentin  Upper abdominal pain -Possibly secondary to duodenal ulcer -CT scan done 1 week ago was suggestive of cholecystitis, minor umbilical and left inguinal hernias. -LFTs appear to be normal -Treat symptomatically  Urinary retention -Foley catheter was placed however causing patient irritation and family requesting to have it removed -Flomax started -Foley catheter was removed, and patient has been able to urinate.  We will continue to monitor  Acute kidney injury -Resolved, creatinine on admission 1.78, currently down to 1.02 -Possibly secondary to urinary retention vs medications  Diabetes mellitus, type II -Hemoglobin A1c 6.6 -metformin held- resume on  discharge -CBG's appear to be  controlled  Essential hypertension -Amlodipine and Cozaar held, currently BP remains stable -resume on discharge and discuss with PCP  Hyperlipidemia -Continue statin   Dementia with agitation  -Continue Namenda -placed on ativan and haldol PRN (patient threatend his family members)  Hypokalemia/hypomagnesemia -potassium 3.4, replaced, repeat BMP in one week -magnesium 1.7, replaced, repeat in one week  Deconditioning -PT recommended home health -OT evaluated patient, no further recommendations needed  Consultants Gastroenterology  Procedures  EGD  Discharge Exam: Vitals:   11/21/17 2335 11/22/17 0310  BP: 132/61 129/65  Pulse: (!) 54 73  Resp:    Temp: 97.8 F (36.6 C) 98.8 F (37.1 C)  SpO2: 94% 95%     General: Well developed, well nourished, NAD, appears stated age  HEENT: NCAT, mucous membranes moist.  Neck: Supple  Cardiovascular: S1 S2 auscultated, RRR, no murmur  Respiratory: Clear to auscultation bilaterally with equal chest rise  Abdomen: Soft, nontender, nondistended, + bowel sounds  Extremities: warm dry without cyanosis clubbing or edema  Neuro: AAOx1 (self only), nonfocal, dementia.  Psych: appropriate mood and affect, pleasant  Discharge Instructions Discharge Instructions    Discharge instructions   Complete by:  As directed    Patient will be discharged to home with home health physical and occupational therapy.  Patient will need to follow up with primary care provider within one week of discharge, repeat CBC, BMP, and magnesium.  Follow up with gastroenterology.  Patient should continue medications as prescribed.  Patient should follow a carb modified diet.     Allergies as of 11/22/2017      Reactions   Morphine And Related Hives, Itching      Medication List    TAKE these medications   albuterol 108 (90 Base) MCG/ACT inhaler Commonly known as:  PROVENTIL HFA;VENTOLIN HFA Inhale 2 puffs into the lungs 4 (four) times  daily.   amLODipine 10 MG tablet Commonly known as:  NORVASC Take 10 mg by mouth at bedtime.   amoxicillin-clavulanate 875-125 MG tablet Commonly known as:  AUGMENTIN Take 1 tablet by mouth 2 (two) times daily for 5 days.   losartan 100 MG tablet Commonly known as:  COZAAR Take 100 mg by mouth every morning.   memantine 10 MG tablet Commonly known as:  NAMENDA Take 5 mg by mouth 2 (two) times daily.   metFORMIN 500 MG tablet Commonly known as:  GLUCOPHAGE Take 500 mg by mouth 2 (two) times daily.   niacin 500 MG CR tablet Commonly known as:  NIASPAN Take 1,000 mg by mouth at bedtime.   pantoprazole 40 MG tablet Commonly known as:  PROTONIX Take 1 tablet (40 mg total) by mouth 2 (two) times daily.   QUEtiapine 100 MG tablet Commonly known as:  SEROQUEL Take 100 mg by mouth at bedtime.   simvastatin 40 MG tablet Commonly known as:  ZOCOR Take 20 mg by mouth at bedtime.   tamsulosin 0.4 MG Caps capsule Commonly known as:  FLOMAX Take 1 capsule (0.4 mg total) by mouth daily. Start taking on:  11/23/2017      Allergies  Allergen Reactions  . Morphine And Related Hives and Itching   Follow-up Information    [facility] Schedule an appointment as soon as possible for a visit in 1 week(s).   Why:  Hospital follow up Contact information: [address] [place] [address] [phone]        Benancio Deeds, MD. Schedule an  appointment as soon as possible for a visit in 4 week(s).   Specialty:  Gastroenterology Why:  May follow up with Dr. Adela Lank or GI in [facility] information: [address] [place] [address] [place] [address] [phone]            The results of significant diagnostics from this hospitalization (including imaging, microbiology, ancillary and laboratory) are listed below for reference.    Significant Diagnostic Studies: Dg Chest 2 View  Result Date: 11/19/2017 CLINICAL DATA:  72  y/o M; dark colored rectal bleeding. Cough since the [facility] 11/17/2017. EXAM: CHEST - 2 VIEW COMPARISON:  None. FINDINGS: Normal cardiac silhouette. Bronchitic changes in the lung bases. No consolidation. No pleural effusion or pneumothorax. No acute osseous abnormality is evident. IMPRESSION: Bronchitic changes in the lung bases, possibly aspiration given distribution. No consolidation. Electronically Signed   By: Mitzi Hansen M.D.   On: 11/19/2017 17:23   Ct Abdomen Pelvis W Contrast  Result Date: 11/09/2017 CLINICAL DATA:  Mid abdominal pain. EXAM: CT ABDOMEN AND PELVIS WITH CONTRAST TECHNIQUE: Multidetector CT imaging of the abdomen and pelvis was performed using the standard protocol following bolus administration of intravenous contrast. CONTRAST:  ISOVUE-300 IOPAMIDOL (ISOVUE-300) INJECTION 61% COMPARISON:  None. FINDINGS: Lower chest: Lung bases are clear. No effusions. Heart is normal size. Extensive coronary artery calcifications and aortic calcifications. Hepatobiliary: Mild haziness/stranding around the gallbladder. Small scattered cysts within the liver. No biliary ductal dilatation. Pancreas: No focal abnormality or ductal dilatation. Spleen: No focal abnormality.  Normal size. Adrenals/Urinary Tract: Extensive renovascular calcifications. No hydronephrosis. 2.4 cm cyst in the midpole of the right kidney. Small nodule off the inferior left adrenal gland measures 2 cm. Right adrenal gland and urinary bladder are unremarkable. Stomach/Bowel: Descending colonic and sigmoid diverticulosis. No active diverticulitis. Stomach and small bowel decompressed, unremarkable. Appendix is normal. Vascular/Lymphatic: Diffuse aortic, iliac and branch vessel calcifications. Mild aneurysmal dilatation of the infrarenal aorta, 3.8 cm maximally. Reproductive: Mild prostate enlargement. Other: No free fluid or free air. Small umbilical hernia and left inguinal hernia containing fat. Musculoskeletal: No  acute bony abnormality. IMPRESSION: There is stranding/haziness around the gallbladder which could reflect changes of acute cholecystitis. Consider further evaluation with right upper quadrant ultrasound. 3.8 cm infrarenal abdominal aortic aneurysm. Recommend followup by ultrasound in 2 years. This recommendation follows ACR consensus guidelines: White Paper of the ACR Incidental Findings Committee II on Vascular Findings. J Am Coll Radiol 2013; 10:789-794. Left colonic diverticulosis. Extensive coronary artery disease. Electronically Signed   By: Charlett Nose M.D.   On: 11/09/2017 22:29    Microbiology: No results found for this or any previous visit (from the past 240 hour(s)).   Labs: Basic Metabolic Panel: Recent Labs  Lab 11/17/17 0215 11/19/17 0313 11/20/17 0349 11/21/17 0245 11/22/17 0355  NA 138 137 136 138 135  K 4.8 3.0* 3.2* 3.1* 3.4*  CL 109 107 110 110 109  CO2 20* 21* 23 21* 22  GLUCOSE 144* 115* 111* 91 112*  BUN 31* 8 9 6* 8  CREATININE 1.44* 1.09 1.19 1.07 1.02  CALCIUM 8.4* 8.6* 8.1* 8.1* 8.2*  MG 1.4* 1.1* 1.8 1.5* 1.7  PHOS 1.9*  --   --   --   --    Liver Function Tests: Recent Labs  Lab 11/16/17 2213 11/17/17 0215  AST 19 13*  ALT 17 14  ALKPHOS 57 41  BILITOT 0.8 0.5  PROT 6.0* 5.2*  ALBUMIN 3.2* 2.7*   No results for  input(s): LIPASE, AMYLASE in the last 168 hours. No results for input(s): AMMONIA in the last 168 hours. CBC: Recent Labs  Lab 11/18/17 0834  11/19/17 0313 11/20/17 0349 11/20/17 0706 11/21/17 0245 11/22/17 0355  WBC 7.6  --  13.1* 16.3*  --  15.0* 8.2  HGB 8.9*   < > 9.2* 8.0* 8.0* 7.5* 9.2*  HCT 27.1*   < > 26.4* 24.0* 24.4* 23.2* 26.7*  MCV 92.2  --  90.4 91.3  --  93.5 89.3  PLT 277  --  281 290  --  299 284   < > = values in this interval not displayed.   Cardiac Enzymes: Recent Labs  Lab 11/17/17 0213 11/17/17 0759 11/17/17 1425  TROPONINI <0.03 <0.03 <0.03   BNP: BNP (last 3 results) No results for input(s):  BNP in the last 8760 hours.  ProBNP (last 3 results) No results for input(s): PROBNP in the last 8760 hours.  CBG: Recent Labs  Lab 11/21/17 1131 11/21/17 1631 11/21/17 2120 11/22/17 0604 11/22/17 1121  GLUCAP 141* 135* 122* 104* 132*       Signed:  Rex Oesterle  [facility] 11/22/2017, 11:37 AM

## 2017-12-30 ENCOUNTER — Ambulatory Visit: Payer: No Typology Code available for payment source | Admitting: Gastroenterology

## 2020-05-09 IMAGING — CT CT ABD-PELV W/ CM
2 of 5 series · 16 of 46 positions shown, 18 images · IV contrast (APPLIED)
Comparison: None.

CLINICAL DATA: Mid abdominal pain.

EXAM:
CT ABDOMEN AND PELVIS WITH CONTRAST
TECHNIQUE: Multidetector CT imaging of the abdomen and pelvis was performed
using the standard protocol following bolus administration of
intravenous contrast.
CONTRAST:  100mL ZFKTCR-R55 IOPAMIDOL (ZFKTCR-R55) INJECTION 61%

[Series 2: axial st · axial · 0.93mm/px · z∈[-572,-122]mm · 13 of 102 slices shown, 15 images]
[im 6/102  soft-tissue]
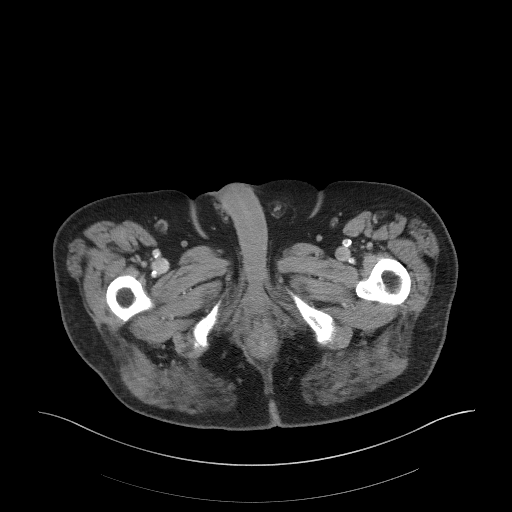
[im 6/102  bone]
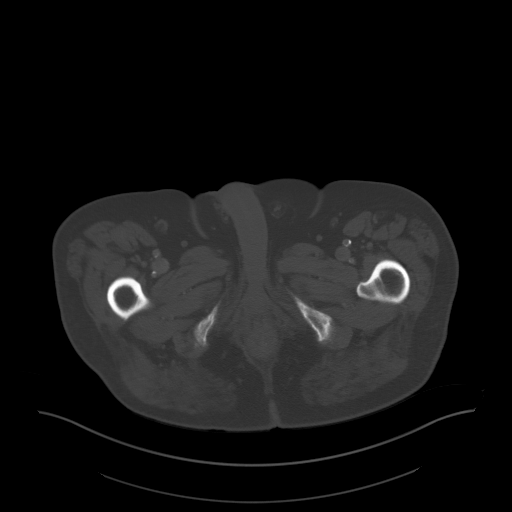
[im 12/102  soft-tissue]
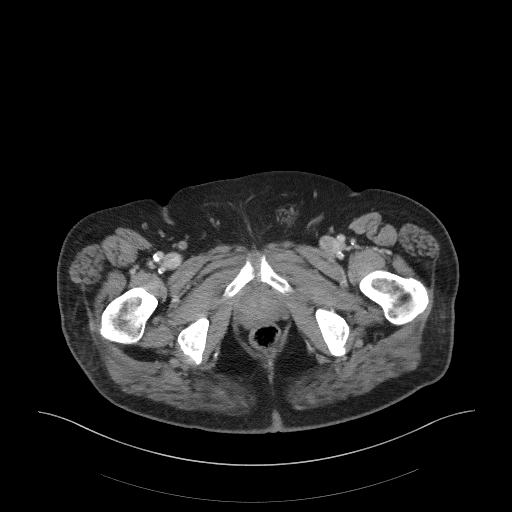
[im 23/102  soft-tissue]
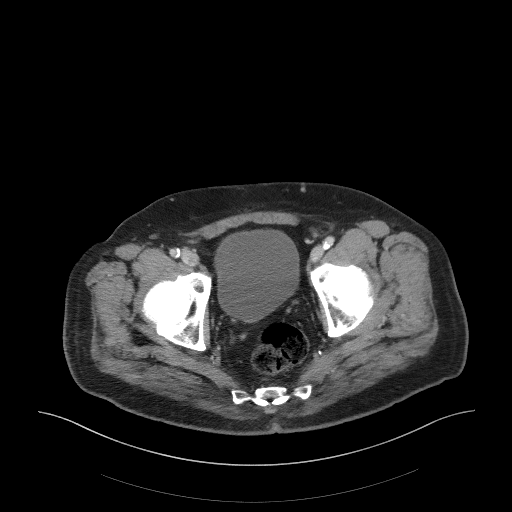
[im 29/102  soft-tissue]
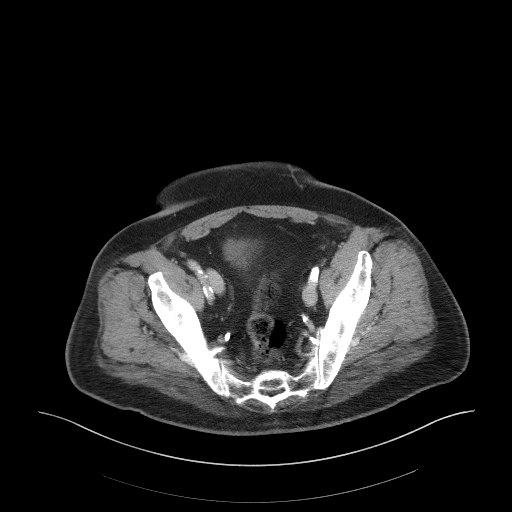
[im 34/102  soft-tissue]
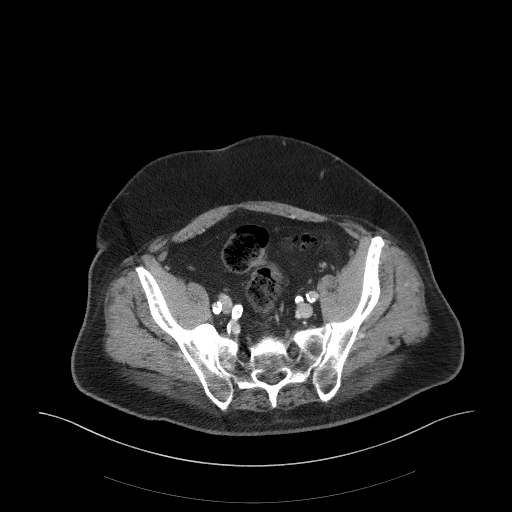
[im 45/102  soft-tissue]
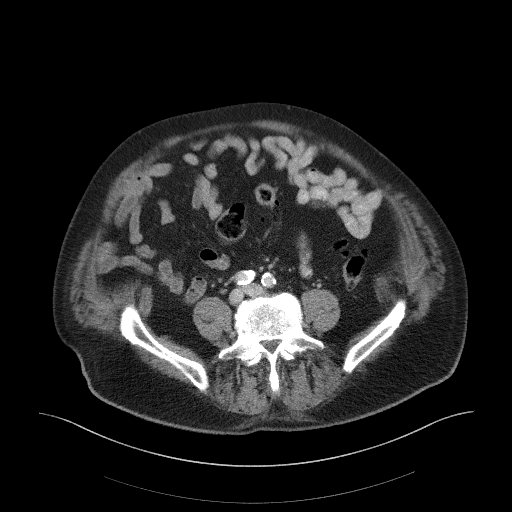
[im 51/102  soft-tissue]
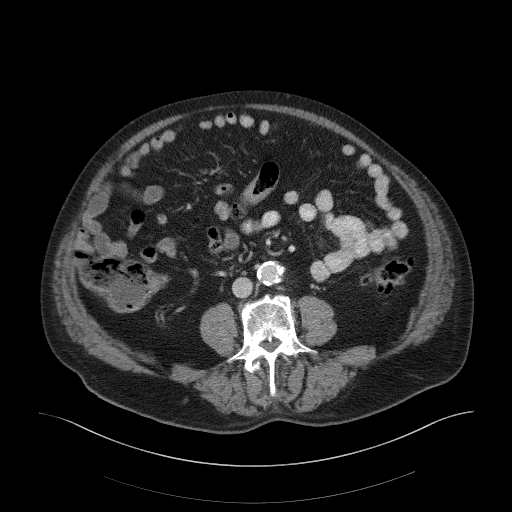
[im 57/102  soft-tissue]
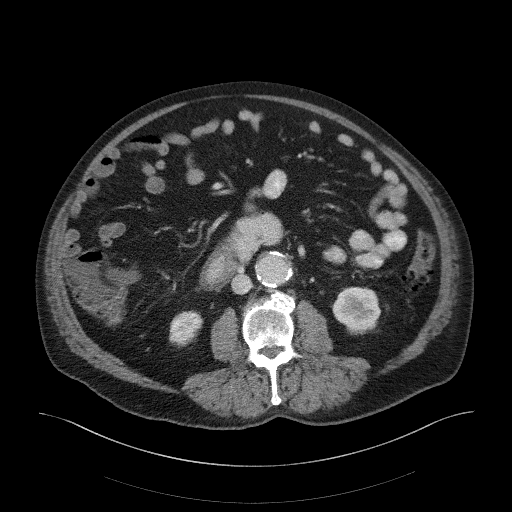
[im 68/102  soft-tissue]
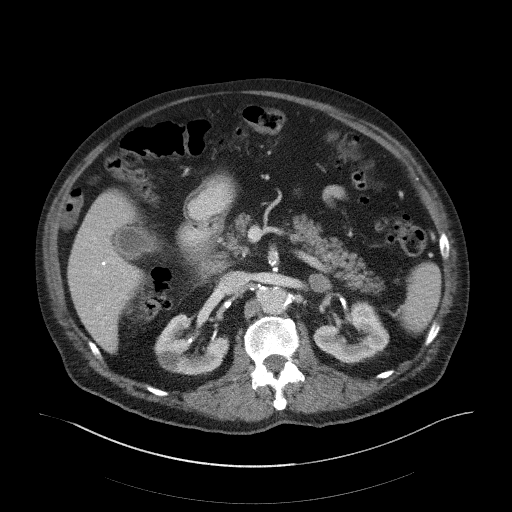
[im 68/102  bone]
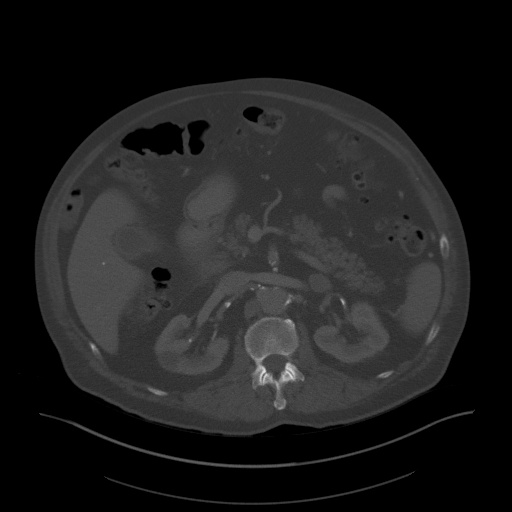
[im 73/102  soft-tissue]
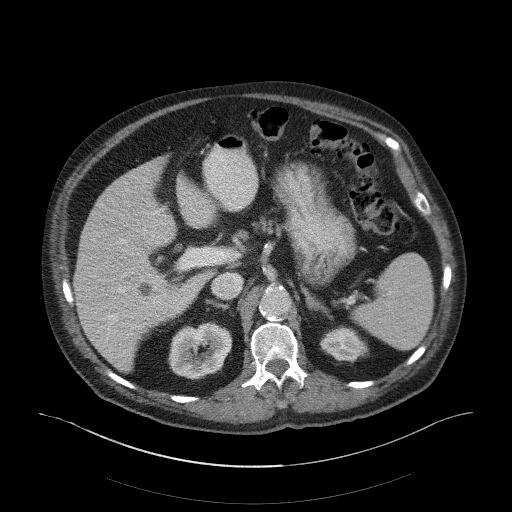
[im 79/102  soft-tissue]
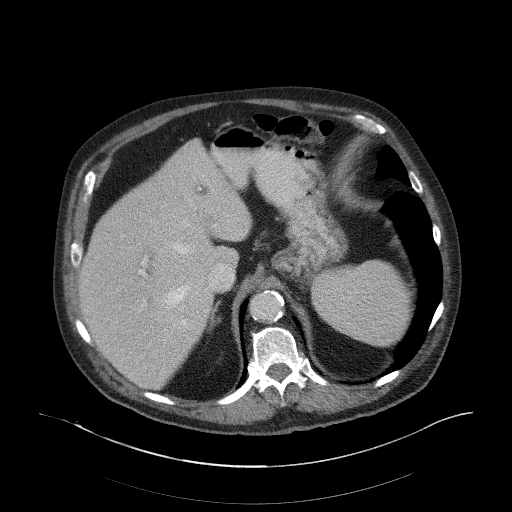
[im 90/102  soft-tissue]
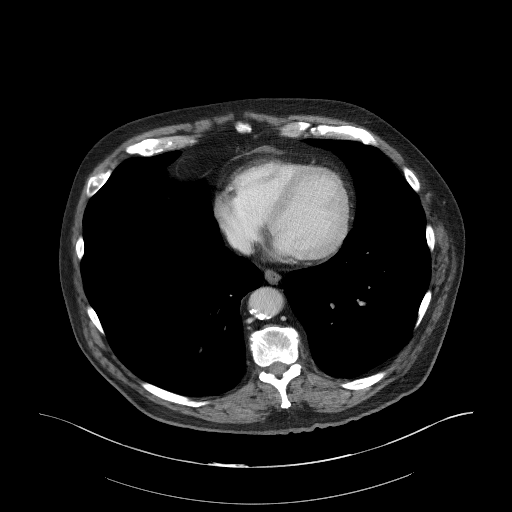
[im 96/102  soft-tissue]
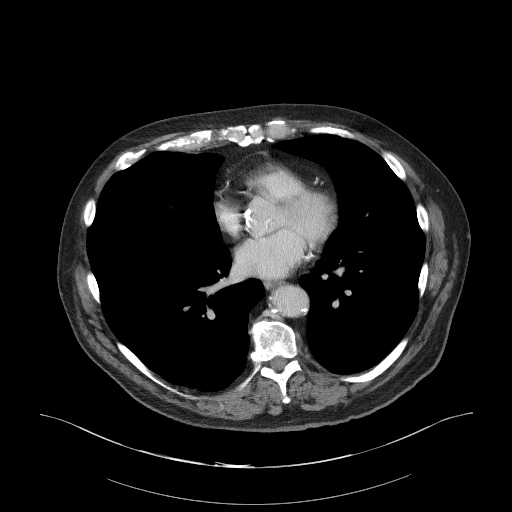

[Series 5: coronal st · coronal · 0.85mm/px · 3 of 113 slices shown]
[im 38/113  soft-tissue]
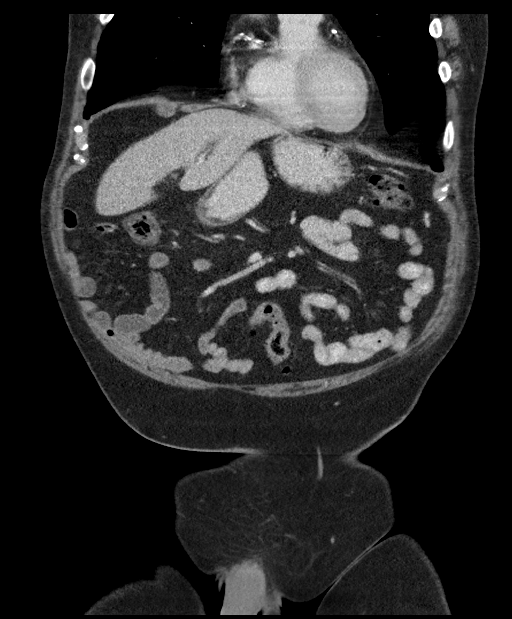
[im 50/113  soft-tissue]
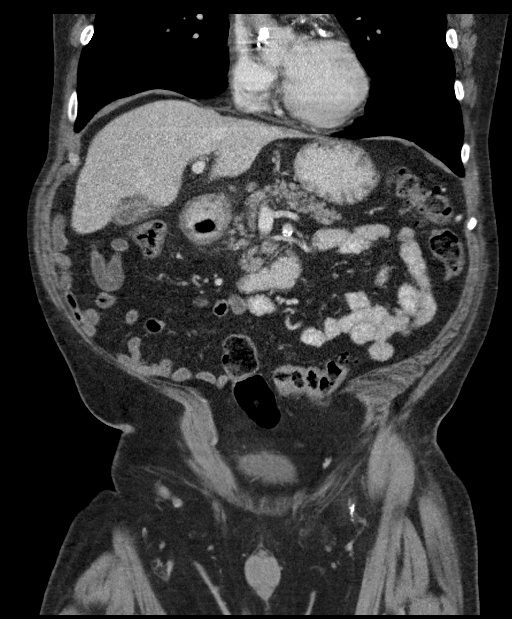
[im 63/113  soft-tissue]
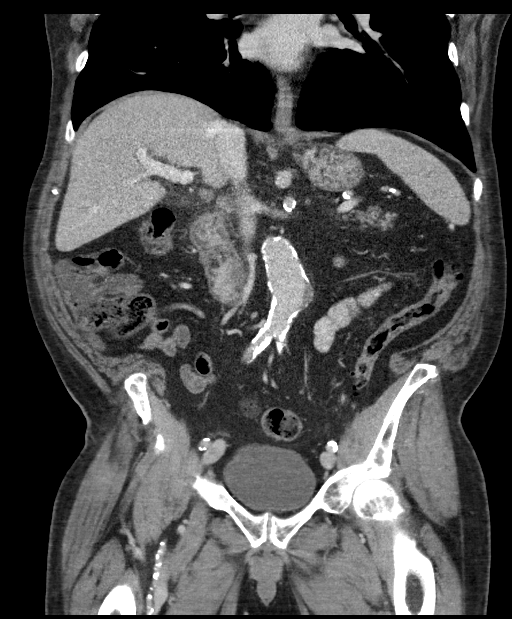

[16 of 46 positions shown; findings below may reference images not displayed]

FINDINGS: Lower chest: Lung bases are clear. No effusions. Heart is normal
size. Extensive coronary artery calcifications and aortic
calcifications.

Hepatobiliary: Mild haziness/stranding around the gallbladder. Small
scattered cysts within the liver. No biliary ductal dilatation.

Pancreas: No focal abnormality or ductal dilatation.

Spleen: No focal abnormality.  Normal size.

Adrenals/Urinary Tract: Extensive renovascular calcifications. No
hydronephrosis. 2.4 cm cyst in the midpole of the right kidney.
Small nodule off the inferior left adrenal gland measures 2 cm.
Right adrenal gland and urinary bladder are unremarkable.

Stomach/Bowel: Descending colonic and sigmoid diverticulosis. No
active diverticulitis. Stomach and small bowel decompressed,
unremarkable. Appendix is normal.

Vascular/Lymphatic: Diffuse aortic, iliac and branch vessel
calcifications. Mild aneurysmal dilatation of the infrarenal aorta,
3.8 cm maximally.

Reproductive: Mild prostate enlargement.

Other: No free fluid or free air. Small umbilical hernia and left
inguinal hernia containing fat.

Musculoskeletal: No acute bony abnormality.
IMPRESSION: There is stranding/haziness around the gallbladder which could
reflect changes of acute cholecystitis. Consider further evaluation
with right upper quadrant ultrasound.

3.8 cm infrarenal abdominal aortic aneurysm. Recommend followup by
ultrasound in 2 years. This recommendation follows ACR consensus
guidelines: White Paper of the ACR Incidental Findings Committee II
on Vascular Findings. [HOSPITAL] 7288; [DATE].

Left colonic diverticulosis.

Extensive coronary artery disease.

## 2020-05-19 DEATH — deceased
